# Patient Record
Sex: Male | Born: 1960 | Race: Black or African American | Hispanic: No | Marital: Single | Smoking: Former smoker
Health system: Southern US, Community
[De-identification: ages and names within clinical notes are randomized; demographics above are authoritative.]

## PROBLEM LIST (undated history)

## (undated) DIAGNOSIS — J449 Chronic obstructive pulmonary disease, unspecified: Secondary | ICD-10-CM

---

## 2020-05-16 ENCOUNTER — Emergency Department (HOSPITAL_COMMUNITY): Payer: BC Managed Care – PPO

## 2020-05-16 ENCOUNTER — Inpatient Hospital Stay (HOSPITAL_COMMUNITY)
Admission: EM | Admit: 2020-05-16 | Discharge: 2020-05-20 | DRG: 177 | Disposition: A | Discharge status: Home / Self Care | Payer: BC Managed Care – PPO | Attending: Internal Medicine | Admitting: Internal Medicine

## 2020-05-16 ENCOUNTER — Encounter (HOSPITAL_COMMUNITY): Payer: Self-pay | Admitting: Emergency Medicine

## 2020-05-16 ENCOUNTER — Other Ambulatory Visit: Payer: Self-pay

## 2020-05-16 DIAGNOSIS — Z79899 Other long term (current) drug therapy: Secondary | ICD-10-CM | POA: Diagnosis not present

## 2020-05-16 DIAGNOSIS — D538 Other specified nutritional anemias: Secondary | ICD-10-CM | POA: Diagnosis not present

## 2020-05-16 DIAGNOSIS — A419 Sepsis, unspecified organism: Secondary | ICD-10-CM | POA: Diagnosis not present

## 2020-05-16 DIAGNOSIS — M954 Acquired deformity of chest and rib: Secondary | ICD-10-CM | POA: Diagnosis present

## 2020-05-16 DIAGNOSIS — D573 Sickle-cell trait: Secondary | ICD-10-CM

## 2020-05-16 DIAGNOSIS — U071 COVID-19: Secondary | ICD-10-CM | POA: Diagnosis present

## 2020-05-16 DIAGNOSIS — R54 Age-related physical debility: Secondary | ICD-10-CM | POA: Diagnosis present

## 2020-05-16 DIAGNOSIS — J449 Chronic obstructive pulmonary disease, unspecified: Secondary | ICD-10-CM | POA: Diagnosis present

## 2020-05-16 DIAGNOSIS — J9601 Acute respiratory failure with hypoxia: Secondary | ICD-10-CM | POA: Diagnosis present

## 2020-05-16 DIAGNOSIS — I5043 Acute on chronic combined systolic (congestive) and diastolic (congestive) heart failure: Secondary | ICD-10-CM | POA: Diagnosis present

## 2020-05-16 DIAGNOSIS — F1490 Cocaine use, unspecified, uncomplicated: Secondary | ICD-10-CM

## 2020-05-16 DIAGNOSIS — I2699 Other pulmonary embolism without acute cor pulmonale: Secondary | ICD-10-CM | POA: Diagnosis present

## 2020-05-16 DIAGNOSIS — Z87891 Personal history of nicotine dependence: Secondary | ICD-10-CM | POA: Diagnosis not present

## 2020-05-16 DIAGNOSIS — R55 Syncope and collapse: Secondary | ICD-10-CM | POA: Diagnosis present

## 2020-05-16 DIAGNOSIS — I2609 Other pulmonary embolism with acute cor pulmonale: Secondary | ICD-10-CM | POA: Diagnosis not present

## 2020-05-16 DIAGNOSIS — I959 Hypotension, unspecified: Secondary | ICD-10-CM | POA: Diagnosis present

## 2020-05-16 DIAGNOSIS — I2601 Septic pulmonary embolism with acute cor pulmonale: Secondary | ICD-10-CM | POA: Diagnosis not present

## 2020-05-16 DIAGNOSIS — I82431 Acute embolism and thrombosis of right popliteal vein: Secondary | ICD-10-CM | POA: Diagnosis present

## 2020-05-16 DIAGNOSIS — Z7901 Long term (current) use of anticoagulants: Secondary | ICD-10-CM

## 2020-05-16 DIAGNOSIS — R0602 Shortness of breath: Secondary | ICD-10-CM | POA: Diagnosis present

## 2020-05-16 DIAGNOSIS — I82441 Acute embolism and thrombosis of right tibial vein: Secondary | ICD-10-CM | POA: Diagnosis not present

## 2020-05-16 DIAGNOSIS — I5031 Acute diastolic (congestive) heart failure: Secondary | ICD-10-CM | POA: Diagnosis present

## 2020-05-16 DIAGNOSIS — J1282 Pneumonia due to coronavirus disease 2019: Secondary | ICD-10-CM | POA: Diagnosis not present

## 2020-05-16 DIAGNOSIS — I5023 Acute on chronic systolic (congestive) heart failure: Secondary | ICD-10-CM | POA: Diagnosis not present

## 2020-05-16 DIAGNOSIS — Z800 Family history of malignant neoplasm of digestive organs: Secondary | ICD-10-CM

## 2020-05-16 DIAGNOSIS — J189 Pneumonia, unspecified organism: Secondary | ICD-10-CM | POA: Diagnosis not present

## 2020-05-16 DIAGNOSIS — D649 Anemia, unspecified: Secondary | ICD-10-CM | POA: Diagnosis present

## 2020-05-16 DIAGNOSIS — R42 Dizziness and giddiness: Secondary | ICD-10-CM

## 2020-05-16 DIAGNOSIS — I82409 Acute embolism and thrombosis of unspecified deep veins of unspecified lower extremity: Secondary | ICD-10-CM

## 2020-05-16 HISTORY — DX: Chronic obstructive pulmonary disease, unspecified: J44.9

## 2020-05-16 LAB — CBC WITH DIFFERENTIAL/PLATELET
Abs Immature Granulocytes: 0.17 10*3/uL — ABNORMAL HIGH (ref 0.00–0.07)
Basophils Absolute: 0.1 10*3/uL (ref 0.0–0.1)
Basophils Relative: 0 %
Eosinophils Absolute: 0 10*3/uL (ref 0.0–0.5)
Eosinophils Relative: 0 %
HCT: 27.9 % — ABNORMAL LOW (ref 39.0–52.0)
Hemoglobin: 9.8 g/dL — ABNORMAL LOW (ref 13.0–17.0)
Immature Granulocytes: 1 %
Lymphocytes Relative: 4 %
Lymphs Abs: 1 10*3/uL (ref 0.7–4.0)
MCH: 29.6 pg (ref 26.0–34.0)
MCHC: 35.1 g/dL (ref 30.0–36.0)
MCV: 84.3 fL (ref 80.0–100.0)
Monocytes Absolute: 2.1 10*3/uL — ABNORMAL HIGH (ref 0.1–1.0)
Monocytes Relative: 10 %
Neutro Abs: 18.5 10*3/uL — ABNORMAL HIGH (ref 1.7–7.7)
Neutrophils Relative %: 85 %
Platelets: 147 10*3/uL — ABNORMAL LOW (ref 150–400)
RBC: 3.31 MIL/uL — ABNORMAL LOW (ref 4.22–5.81)
RDW: 17.7 % — ABNORMAL HIGH (ref 11.5–15.5)
WBC: 21.8 10*3/uL — ABNORMAL HIGH (ref 4.0–10.5)
nRBC: 1.5 % — ABNORMAL HIGH (ref 0.0–0.2)

## 2020-05-16 LAB — TROPONIN I (HIGH SENSITIVITY)
Troponin I (High Sensitivity): 272 ng/L (ref <18)
Troponin I (High Sensitivity): 471 ng/L (ref <18)

## 2020-05-16 LAB — COMPREHENSIVE METABOLIC PANEL
ALT: 22 U/L (ref 0–44)
AST: 51 U/L — ABNORMAL HIGH (ref 15–41)
Albumin: 3 g/dL — ABNORMAL LOW (ref 3.5–5.0)
Alkaline Phosphatase: 57 U/L (ref 38–126)
Anion gap: 6 (ref 5–15)
BUN: 15 mg/dL (ref 6–20)
CO2: 21 mmol/L — ABNORMAL LOW (ref 22–32)
Calcium: 7.9 mg/dL — ABNORMAL LOW (ref 8.9–10.3)
Chloride: 114 mmol/L — ABNORMAL HIGH (ref 98–111)
Creatinine, Ser: 0.89 mg/dL (ref 0.61–1.24)
GFR, Estimated: >60 mL/min (ref >60)
Glucose, Bld: 133 mg/dL — ABNORMAL HIGH (ref 70–99)
Potassium: 4.4 mmol/L (ref 3.5–5.1)
Sodium: 141 mmol/L (ref 135–145)
Total Bilirubin: 3.5 mg/dL — ABNORMAL HIGH (ref 0.3–1.2)
Total Protein: 6.7 g/dL (ref 6.5–8.1)

## 2020-05-16 LAB — BRAIN NATRIURETIC PEPTIDE: B Natriuretic Peptide: 99.3 pg/mL (ref 0.0–100.0)

## 2020-05-16 LAB — SARS CORONAVIRUS 2 BY RT PCR (HOSPITAL ORDER, PERFORMED IN ~~LOC~~ HOSPITAL LAB): SARS Coronavirus 2: POSITIVE — AB

## 2020-05-16 MED ORDER — METHYLPREDNISOLONE SODIUM SUCC 125 MG IJ SOLR
125.0000 mg | Freq: Once | INTRAMUSCULAR | Status: AC
  Administered 2020-05-16: 125 mg via INTRAVENOUS
  Filled 2020-05-16: qty 2

## 2020-05-16 MED ORDER — GUAIFENESIN-DM 100-10 MG/5ML PO SYRP: 10.0000 mL | ORAL_SOLUTION | ORAL | Status: DC | PRN

## 2020-05-16 MED ORDER — HEPARIN BOLUS VIA INFUSION
3500.0000 [IU] | Freq: Once | INTRAVENOUS | Status: DC
  Administered 2020-05-16: 3500 [IU] via INTRAVENOUS
  Filled 2020-05-16: qty 3500

## 2020-05-16 MED ORDER — SODIUM CHLORIDE 0.9% FLUSH
3.0000 mL | Freq: Two times a day (BID) | INTRAVENOUS | Status: DC
  Administered 2020-05-17 – 2020-05-20 (×6): 3 mL via INTRAVENOUS

## 2020-05-16 MED ORDER — HEPARIN BOLUS VIA INFUSION: 4000.0000 [IU] | Freq: Once | INTRAVENOUS | Status: DC

## 2020-05-16 MED ORDER — ACETAMINOPHEN 325 MG PO TABS: 650.0000 mg | ORAL_TABLET | Freq: Four times a day (QID) | ORAL | Status: DC | PRN

## 2020-05-16 MED ORDER — SODIUM CHLORIDE 0.9 % IV SOLN
200.0000 mg | Freq: Once | INTRAVENOUS | Status: AC
  Administered 2020-05-17: 200 mg via INTRAVENOUS
  Filled 2020-05-16: qty 200

## 2020-05-16 MED ORDER — ONDANSETRON HCL 4 MG/2ML IJ SOLN: 4.0000 mg | Freq: Four times a day (QID) | INTRAMUSCULAR | Status: DC | PRN

## 2020-05-16 MED ORDER — ALBUTEROL SULFATE HFA 108 (90 BASE) MCG/ACT IN AERS
2.0000 | INHALATION_SPRAY | Freq: Four times a day (QID) | RESPIRATORY_TRACT | Status: DC
  Administered 2020-05-17 (×3): 2 via RESPIRATORY_TRACT

## 2020-05-16 MED ORDER — HYDROCODONE-ACETAMINOPHEN 5-325 MG PO TABS: 1.0000 | ORAL_TABLET | ORAL | Status: DC | PRN

## 2020-05-16 MED ORDER — SODIUM CHLORIDE 0.9 % IV BOLUS
1000.0000 mL | Freq: Once | INTRAVENOUS | Status: AC
  Administered 2020-05-16: 1000 mL via INTRAVENOUS

## 2020-05-16 MED ORDER — HEPARIN (PORCINE) 25000 UT/250ML-% IV SOLN
1100.0000 [IU]/h | INTRAVENOUS | Status: DC
  Administered 2020-05-16: 1100 [IU]/h via INTRAVENOUS
  Filled 2020-05-16: qty 250

## 2020-05-16 MED ORDER — HEPARIN (PORCINE) 25000 UT/250ML-% IV SOLN: 800.0000 [IU]/h | INTRAVENOUS | Status: DC

## 2020-05-16 MED ORDER — SODIUM CHLORIDE 0.9 % IV SOLN: INTRAVENOUS | Status: AC

## 2020-05-16 MED ORDER — SODIUM CHLORIDE 0.9 % IV SOLN
100.0000 mg | Freq: Every day | INTRAVENOUS | Status: AC
  Administered 2020-05-17 – 2020-05-20 (×4): 100 mg via INTRAVENOUS
  Filled 2020-05-16 (×4): qty 20

## 2020-05-16 MED ORDER — ALBUTEROL SULFATE HFA 108 (90 BASE) MCG/ACT IN AERS
4.0000 | INHALATION_SPRAY | Freq: Once | RESPIRATORY_TRACT | Status: AC
  Administered 2020-05-16: 4 via RESPIRATORY_TRACT
  Filled 2020-05-16: qty 6.7

## 2020-05-16 MED ORDER — IOHEXOL 350 MG/ML SOLN
100.0000 mL | Freq: Once | INTRAVENOUS | Status: AC | PRN
  Administered 2020-05-16: 100 mL via INTRAVENOUS

## 2020-05-16 MED ORDER — ONDANSETRON HCL 4 MG PO TABS: 4.0000 mg | ORAL_TABLET | Freq: Four times a day (QID) | ORAL | Status: DC | PRN

## 2020-05-16 MED ORDER — HEPARIN (PORCINE) 25000 UT/250ML-% IV SOLN: 1000.0000 [IU]/h | INTRAVENOUS | Status: DC

## 2020-05-16 MED ORDER — HEPARIN BOLUS VIA INFUSION
3500.0000 [IU] | Freq: Once | INTRAVENOUS | Status: DC
  Filled 2020-05-16: qty 3500

## 2020-05-16 MED ORDER — DEXAMETHASONE SODIUM PHOSPHATE 10 MG/ML IJ SOLN
6.0000 mg | INTRAMUSCULAR | Status: DC
  Administered 2020-05-17: 6 mg via INTRAVENOUS
  Filled 2020-05-16: qty 1

## 2020-05-16 NOTE — ED Notes (Signed)
Date and time results received: 05/16/20 2005 (use smartphrase ".now" to insert current time)  Test: Troponin Critical Value: 272  Name of Provider Notified:  Orders Received? Or Actions Taken?:waiting on orders

## 2020-05-16 NOTE — ED Triage Notes (Signed)
Pt arrived via EMS from [facility] in [place]. Pt had a syncopal episode and reported to EMS that he has had generalized weakness, shortness of breath, cough, and diarrhea for the past week. Pt has hx of COPD. Pt's initial pressure with EMS was 60 palpated. Pt did not hit his head when he had his syncopal episode. Pt was assisted to the ground by his girlfriend.

## 2020-05-16 NOTE — ED Provider Notes (Signed)
[facility]-EMERGENCY DEPT Provider Note   CSN: [id number] Arrival date & time: 05/16/20  1724     History Chief Complaint  Patient presents with  . Shortness of Breath  . Loss of Consciousness    Felicity PellegriniJerrell Sokolow is a 60 y.o. male.  Patient felt weak and almost passed out today.  He has been coughing for a while and having some diarrhea.  Patient also had dyspnea  The history is provided by the patient and medical records. No language interpreter was used.  Shortness of Breath Severity:  Moderate Onset quality:  Sudden Timing:  Constant Progression:  Worsening Chronicity:  Recurrent Context: activity   Relieved by:  Nothing Worsened by:  Nothing Ineffective treatments:  None tried Associated symptoms: syncope   Associated symptoms: no abdominal pain, no chest pain, no cough, no headaches and no rash   Loss of Consciousness Associated symptoms: shortness of breath   Associated symptoms: no chest pain, no headaches and no seizures        Past Medical History:  Diagnosis Date  . COPD (chronic obstructive pulmonary disease) (HCC)     There are no problems to display for this patient.   History reviewed. No pertinent surgical history.     History reviewed. No pertinent family history.  Social History   Tobacco Use  . Smoking status: Former Smoker    Quit date: 05/16/2016    Years since quitting: 4.0  . Smokeless tobacco: Never Used    Home Medications Prior to Admission medications   Medication Sig Start Date End Date Taking? Authorizing Provider  sildenafil (VIAGRA) 100 MG tablet Take 100 mg by mouth daily as needed for erectile dysfunction.   Yes [provider]    Allergies    Patient has no known allergies.  Review of Systems   Review of Systems  Constitutional: Negative for appetite change and fatigue.  HENT: Negative for congestion, ear discharge and sinus pressure.   Eyes: Negative for discharge.  Respiratory:  Positive for shortness of breath. Negative for cough.   Cardiovascular: Positive for syncope. Negative for chest pain.  Gastrointestinal: Negative for abdominal pain and diarrhea.  Genitourinary: Negative for frequency and hematuria.  Musculoskeletal: Negative for back pain.  Skin: Negative for rash.  Neurological: Negative for seizures and headaches.  Psychiatric/Behavioral: Negative for hallucinations.    Physical Exam Updated Vital Signs BP (!) 126/95   Pulse (!) 118   Temp (!) 97.4 F (36.3 C) (Oral)   Resp 20   Ht 5\' 8"  (1.727 m)   Wt 64.4 kg   SpO2 93%   BMI 21.59 kg/m   Physical Exam Vitals reviewed.  Constitutional:      Appearance: He is well-developed.  HENT:     Head: Normocephalic.     Nose: Nose normal.  Eyes:     General: No scleral icterus.    Extraocular Movements: EOM normal.     Conjunctiva/sclera: Conjunctivae normal.  Neck:     Thyroid: No thyromegaly.  Cardiovascular:     Rate and Rhythm: Normal rate and regular rhythm.     Heart sounds: No murmur heard. No friction rub. No gallop.   Pulmonary:     Breath sounds: No stridor. No wheezing or rales.     Comments: Tachypnea Chest:     Chest wall: No tenderness.  Abdominal:     General: There is no distension.     Tenderness: There is no abdominal tenderness. There is no rebound.  Musculoskeletal:        General: No edema. Normal range of motion.     Cervical back: Neck supple.  Lymphadenopathy:     Cervical: No cervical adenopathy.  Skin:    Findings: No erythema or rash.  Neurological:     Mental Status: He is oriented to person, place, and time.     Motor: No abnormal muscle tone.     Coordination: Coordination normal.  Psychiatric:        Mood and Affect: Mood and affect normal.        Behavior: Behavior normal.     ED Results / Procedures / Treatments   Labs (all labs ordered are listed, but only abnormal results are displayed) Labs Reviewed  SARS CORONAVIRUS 2 BY RT PCR  (HOSPITAL ORDER, PERFORMED IN [facility]) - Abnormal; Notable for the following components:      Result Value   SARS Coronavirus 2 POSITIVE (*)    All other components within normal limits  CBC WITH DIFFERENTIAL/PLATELET - Abnormal; Notable for the following components:   WBC 21.8 (*)    RBC 3.31 (*)    Hemoglobin 9.8 (*)    HCT 27.9 (*)    RDW 17.7 (*)    Platelets 147 (*)    nRBC 1.5 (*)    Neutro Abs 18.5 (*)    Monocytes Absolute 2.1 (*)    Abs Immature Granulocytes 0.17 (*)    All other components within normal limits  COMPREHENSIVE METABOLIC PANEL - Abnormal; Notable for the following components:   Chloride 114 (*)    CO2 21 (*)    Glucose, Bld 133 (*)    Calcium 7.9 (*)    Albumin 3.0 (*)    AST 51 (*)    Total Bilirubin 3.5 (*)    All other components within normal limits  TROPONIN I (HIGH SENSITIVITY) - Abnormal; Notable for the following components:   Troponin I (High Sensitivity) 272 (*)    All other components within normal limits  TROPONIN I (HIGH SENSITIVITY) - Abnormal; Notable for the following components:   Troponin I (High Sensitivity) 471 (*)    All other components within normal limits  BRAIN NATRIURETIC PEPTIDE    EKG EKG Interpretation  Date/Time:  Sunday May 16 2020 22:49:07 EST Ventricular Rate:  119 PR Interval:    QRS Duration: 90 QT Interval:  329 QTC Calculation: 463 R Axis:   64 Text Interpretation: Sinus tachycardia Abnormal R-wave progression, early transition Borderline T abnormalities, inferior leads Confirmed by Bethann Berkshire [phone]) on 05/16/2020 11:01:51 PM Also confirmed by Bethann Berkshire [phone]  on 05/16/2020 11:02:27 PM   Radiology CT Angio Chest PE W and/or Wo Contrast  Result Date: 05/16/2020 CLINICAL DATA:  Pulmonary embolus suspected with high probability. Syncopal episode. Generalized weakness, shortness of breath, cough, and diarrhea. EXAM: CT ANGIOGRAPHY CHEST WITH CONTRAST TECHNIQUE: Multidetector CT  imaging of the chest was performed using the standard protocol during bolus administration of intravenous contrast. Multiplanar CT image reconstructions and MIPs were obtained to evaluate the vascular anatomy. CONTRAST:  OMNIPAQUE IOHEXOL 350 MG/ML SOLN COMPARISON:  Chest radiograph 05/16/2020 FINDINGS: Cardiovascular: Motion artifact limits examination. There is however moderately good visualization of the central and segmental pulmonary arteries. Multiple filling defects are demonstrated in the distal main pulmonary arteries and in multiple upper lobe and lower lobe segmental branches consistent with multiple acute pulmonary emboli. The RV to LV ratio is 1.2, suggesting evidence for right heart strain. No  pericardial effusions. Normal caliber thoracic aorta. No aortic dissection. Great vessel origins are patent. Mediastinum/Nodes: Small esophageal hiatal hernia. Esophagus is decompressed. No significant lymphadenopathy. Lungs/Pleura: Mild atelectasis in the right lung base. Lungs are otherwise clear. No pleural effusions. No pneumothorax. Airways are patent. Upper Abdomen: No acute abnormalities demonstrated in the visualized upper abdomen. Probable gallstones incompletely evaluated. Musculoskeletal: Degenerative changes in the spine and shoulders. Review of the MIP images confirms the above findings. IMPRESSION: 1. Multiple bilateral acute pulmonary emboli. CT evidence of right heart strain (RV/LV Ratio 1.2) consistent with at least submassive (intermediate risk) PE. The presence of right heart strain has been associated with an increased risk of morbidity and mortality. 2. Small esophageal hiatal hernia. 3. Probable gallstones. Critical Value/emergent results were called by telephone at the time of interpretation on 05/16/2020 at 10:51 pm to provider Zaydyn Havey , who verbally acknowledged these results. Electronically Signed   By: Burman Nieves M.D.   On: 05/16/2020 22:54   DG Chest Port 1  View  Result Date: 05/16/2020 CLINICAL DATA:  Syncopal episode EXAM: PORTABLE CHEST 1 VIEW COMPARISON:  07/29/2019 FINDINGS: There are advanced degenerative changes of the right glenohumeral joint. The heart size is stable from prior study. There is no pneumothorax. No large pleural effusion. No focal infiltrate. IMPRESSION: No active disease. Electronically Signed   By: Katherine Mantle M.D.   On: 05/16/2020 19:02    Procedures Procedures (including critical care time)  Medications Ordered in ED Medications  heparin bolus via infusion 3,500 Units (has no administration in time range)    Followed by  heparin ADULT infusion 100 units/mL (25000 units/237mL) (has no administration in time range)  sodium chloride 0.9 % bolus 1,000 mL (0 mLs Intravenous Stopped 05/16/20 2049)  albuterol (VENTOLIN HFA) 108 (90 Base) MCG/ACT inhaler 4 puff (4 puffs Inhalation Given 05/16/20 1842)  methylPREDNISolone sodium succinate (SOLU-MEDROL) 125 mg/2 mL injection 125 mg (125 mg Intravenous Given 05/16/20 1841)  sodium chloride 0.9 % bolus 1,000 mL (0 mLs Intravenous Stopped 05/16/20 2249)  iohexol (OMNIPAQUE) 350 MG/ML injection 100 mL (100 mLs Intravenous Contrast Given 05/16/20 2245)    ED Course  I have reviewed the triage vital signs and the nursing notes.  Pertinent labs & imaging results that were available during my care of the patient were reviewed by me and considered in my medical decision making (see chart for details). Abdulloh Ullom was evaluated in Emergency Department on 05/20/2020 for the symptoms described in the history of present illness. He was evaluated in the context of the global COVID-19 pandemic, which necessitated consideration that the patient might be at risk for infection with the SARS-CoV-2 virus that causes COVID-19. Institutional protocols and algorithms that pertain to the evaluation of patients at risk for COVID-19 are in a state of rapid change based on information released by  regulatory bodies including the CDC and federal and state organizations. These policies and algorithms were followed during the patient's care in the ED.    CRITICAL CARE Performed by: Bethann Berkshire Total critical care time: 45 minutes Critical care time was exclusive of separately billable procedures and treating other patients. Critical care was necessary to treat or prevent imminent or life-threatening deterioration. Critical care was time spent personally by me on the following activities: development of treatment plan with patient and/or surrogate as well as nursing, discussions with consultants, evaluation of patient's response to treatment, examination of patient, obtaining history from patient or surrogate, ordering and performing treatments and interventions, ordering  and review of laboratory studies, ordering and review of radiographic studies, pulse oximetry and re-evaluation of patient's condition. I initially spoke to cardiology with the elevated troponin.  He suggested getting a CT angio of the chest.  Patient's COVID test did come back positive and had a CT angio showed PE. MDM Rules/Calculators/A&P                          Patient with COVID infection and hypoxia and positive PE.  He will be admitted to medicine and started on heparin Final Clinical Impression(s) / ED Diagnoses Final diagnoses:  Syncope and collapse    Rx / DC Orders ED Discharge Orders    None       Bethann Berkshire, MD 05/20/20 1003

## 2020-05-16 NOTE — ED Notes (Addendum)
Date and time results received: 05/16/20 2227 (use smartphrase ".now" to insert current time)  Test: Troponin Critical Value:471  Name of Provider Notified: zammit md  Orders Received? Or Actions Taken?: Waiting on orders

## 2020-05-16 NOTE — H&P (Addendum)
Derrick Huffman GQB:[id number] DOB: [birth year] DOA: 05/16/2020    PCP: Bethanie Dicker, MD   Outpatient Specialists:  NONE    Patient arrived to ER on 05/16/20 at 1724 Referred by Attending Bethann Berkshire, MD    Patient coming from: home Lives  With SO    Chief Complaint:  Chief Complaint  Patient presents with  . Shortness of Breath  . Loss of Consciousness    HPI: Derrick Huffman is a 60 y.o. male with medical history significant of COPD    Presented with   Syncope and shortness of breath Patient was brought in by EMS from [facility] at [employer].  Where he was found to have a syncopal episode he reported some generalized fatigue shortness of breath cough diarrhea for past 1 week. On EMS arrival his blood pressures was in the 60s.  Denied any head injury. Apparently he was gradually assisted to the ground by his girlfriend Reports he does not smoke he quit when he was diagnosis of COPD  states that he drinks only on occasion   Infectious risk factors:  Reports  shortness of breath, syncope    Has   been vaccinated against COVID last March not boosted   Initial COVID TEST    POSITIVE,     Lab Results  Component Value Date   SARSCOV2NAA POSITIVE (A) 05/16/2020    Regarding pertinent Chronic problems:     COPD - not  followed by pulmonology  not  on baseline oxygen         While in ER: Tachycardic up to 118 Blood pressure stabilized at 126/95 CTA postive for PE Troponin elevated initially 272 ->4 71 CTA showed bilateral PEs with possible right-sided strain Started on heparin drip Also noted to have white blood cell count up to 21.8 ER Provider Called: Cardiology    They Recommend admit to medicine troponin elevation most likely secondary to PE Will see in AM   Hospitalist was called for admission for COVID infection syncope and pulmonary embolism  The following Work up has been ordered so far:  Orders Placed This Encounter  Procedures  . SARS  Coronavirus 2 by RT PCR (hospital order, performed in [facility] hospital lab) Nasopharyngeal Nasopharyngeal Swab  . DG Chest Port 1 View  . CT Angio Chest PE W and/or Wo Contrast  . CBC with Differential/Platelet  . Comprehensive metabolic panel  . Brain natriuretic peptide  . Inpatient consult to Cardiology  ALL PATIENTS BEING ADMITTED/HAVING PROCEDURES NEED COVID-19 SCREENING  . Consult to hospitalist  ALL PATIENTS BEING ADMITTED/HAVING PROCEDURES NEED COVID-19 SCREENING  . heparin per pharmacy consult  . Airborne and Contact precautions  . EKG 12-Lead  . EKG 12-Lead     Following Medications were ordered in ER: Medications  heparin bolus via infusion 3,500 Units (has no administration in time range)    Followed by  heparin ADULT infusion 100 units/mL (25000 units/216mL) (has no administration in time range)  sodium chloride 0.9 % bolus 1,000 mL (0 mLs Intravenous Stopped 05/16/20 2049)  albuterol (VENTOLIN HFA) 108 (90 Base) MCG/ACT inhaler 4 puff (4 puffs Inhalation Given 05/16/20 1842)  methylPREDNISolone sodium succinate (SOLU-MEDROL) 125 mg/2 mL injection 125 mg (125 mg Intravenous Given 05/16/20 1841)  sodium chloride 0.9 % bolus 1,000 mL (0 mLs Intravenous Stopped 05/16/20 2249)  iohexol (OMNIPAQUE) 350 MG/ML injection 100 mL (100 mLs Intravenous Contrast Given 05/16/20 2245)        Consult Orders  (From admission, onward)  Start     Ordered   05/16/20 2255  Consult to hospitalist  ALL PATIENTS BEING ADMITTED/HAVING PROCEDURES NEED COVID-19 SCREENING  Once       Comments: ALL PATIENTS BEING ADMITTED/HAVING PROCEDURES NEED COVID-19 SCREENING  Provider:  (Not yet assigned)  Question Answer Comment  Place call to: [facility],   call [phone]   Reason for Consult Admit      05/16/20 2254           Significant initial  Findings: Abnormal Labs Reviewed  SARS CORONAVIRUS 2 BY RT PCR (HOSPITAL ORDER, PERFORMED IN [place] [facility] LAB) - Abnormal;  Notable for the following components:      Result Value   SARS Coronavirus 2 POSITIVE (*)    All other components within normal limits  CBC WITH DIFFERENTIAL/PLATELET - Abnormal; Notable for the following components:   WBC 21.8 (*)    RBC 3.31 (*)    Hemoglobin 9.8 (*)    HCT 27.9 (*)    RDW 17.7 (*)    Platelets 147 (*)    nRBC 1.5 (*)    Neutro Abs 18.5 (*)    Monocytes Absolute 2.1 (*)    Abs Immature Granulocytes 0.17 (*)    All other components within normal limits  COMPREHENSIVE METABOLIC PANEL - Abnormal; Notable for the following components:   Chloride 114 (*)    CO2 21 (*)    Glucose, Bld 133 (*)    Calcium 7.9 (*)    Albumin 3.0 (*)    AST 51 (*)    Total Bilirubin 3.5 (*)    All other components within normal limits  TROPONIN I (HIGH SENSITIVITY) - Abnormal; Notable for the following components:   Troponin I (High Sensitivity) 272 (*)    All other components within normal limits  TROPONIN I (HIGH SENSITIVITY) - Abnormal; Notable for the following components:   Troponin I (High Sensitivity) 471 (*)    All other components within normal limits    Otherwise labs showing:    Recent Labs  Lab 05/16/20 1842  NA 141  K 4.4  CO2 21*  GLUCOSE 133*  BUN 15  CREATININE 0.89  CALCIUM 7.9*    Cr    stable,    Lab Results  Component Value Date   CREATININE 0.89 05/16/2020    Recent Labs  Lab 05/16/20 1842  AST 51*  ALT 22  ALKPHOS 57  BILITOT 3.5*  PROT 6.7  ALBUMIN 3.0*   Lab Results  Component Value Date   CALCIUM 7.9 (L) 05/16/2020    WBC     Component Value Date/Time   WBC 21.8 (H) 05/16/2020 1842   LYMPHSABS 1.0 05/16/2020 1842   MONOABS 2.1 (H) 05/16/2020 1842   EOSABS 0.0 05/16/2020 1842   BASOSABS 0.1 05/16/2020 1842    Plt: Lab Results  Component Value Date   PLT 147 (L) 05/16/2020    Lactic Acid, Venous  Ordered   Procalcitonin  Ordered   COVID-19 Labs  Recent Labs    05/17/20 0009  FERRITIN 74  CRP 1.3*    Lab  Results  Component Value Date   SARSCOV2NAA POSITIVE (A) 05/16/2020    HG/HCT unsure what the baseline is    Component Value Date/Time   HGB 9.8 (L) 05/16/2020 1842   HCT 27.9 (L) 05/16/2020 1842   MCV 84.3 05/16/2020 1842     Troponin 272 - 471 Cardiac Panel (last 3 results)     ECG: Ordered Personally  reviewed by me showing: HR : 119 Rhythm:   Sinus tachycardia   nonspecific changes,  QTC 463   BNP (last 3 results) Recent Labs    05/16/20 1833  BNP 99.3      UA   no evidence of UTI      Urine analysis:    Component Value Date/Time   COLORURINE YELLOW 05/17/2020 0135   APPEARANCEUR CLEAR 05/17/2020 0135   LABSPEC 1.031 (H) 05/17/2020 0135   PHURINE 5.0 05/17/2020 0135   GLUCOSEU NEGATIVE 05/17/2020 0135   HGBUR NEGATIVE 05/17/2020 0135   BILIRUBINUR NEGATIVE 05/17/2020 0135   KETONESUR NEGATIVE 05/17/2020 0135   PROTEINUR NEGATIVE 05/17/2020 0135   NITRITE NEGATIVE 05/17/2020 0135   LEUKOCYTESUR NEGATIVE 05/17/2020 0135    Ordered   CXR -  NON acute    CTA chest -multiple bilateral PE, evidence of right heart strain no evidence of infiltrate Gallstones    ED Triage Vitals  Enc Vitals Group     BP 05/16/20 1740 (!) 125/96     Pulse Rate 05/16/20 1740 (!) 117     Resp 05/16/20 1740 (!) 26     Temp 05/16/20 1740 (!) 97.4 F (36.3 C)     Temp Source 05/16/20 1740 Oral     SpO2 05/16/20 1738 96 %     Weight 05/16/20 1755 142 lb (64.4 kg)     Height 05/16/20 1755 5\' 8"  (1.727 m)     Head Circumference --      Peak Flow --      Pain Score 05/16/20 1753 0     Pain Loc --      Pain Edu? --      Excl. in GC? --   TMAX(24)@       Latest  Blood pressure (!) 126/95, pulse (!) 118, temperature (!) 97.4 F (36.3 C), temperature source Oral, resp. rate 20, height 5\' 8"  (1.727 m), weight 64.4 kg, SpO2 93 %.   Review of Systems:    Pertinent positives include:   chills, fatigue, shortness of breath at rest.  dyspnea on exertion,   Constitutional:  No  weight loss, night sweats, Fevers, weight loss  HEENT:  No headaches, Difficulty swallowing,Tooth/dental problems,Sore throat,  No sneezing, itching, ear ache, nasal congestion, post nasal drip,  Cardio-vascular:  No chest pain, Orthopnea, PND, anasarca, dizziness, palpitations.no Bilateral lower extremity swelling  GI:  No heartburn, indigestion, abdominal pain, nausea, vomiting, diarrhea, change in bowel habits, loss of appetite, melena, blood in stool, hematemesis Resp:  no No excess mucus, no productive cough, No non-productive cough, No coughing up of blood.No change in color of mucus.No wheezing. Skin:  no rash or lesions. No jaundice GU:  no dysuria, change in color of urine, no urgency or frequency. No straining to urinate.  No flank pain.  Musculoskeletal:  No joint pain or no joint swelling. No decreased range of motion. No back pain.  Psych:  No change in mood or affect. No depression or anxiety. No memory loss.  Neuro: no localizing neurological complaints, no tingling, no weakness, no double vision, no gait abnormality, no slurred speech, no confusion  All systems reviewed and apart from HOPI all are negative  Past Medical History:   Past Medical History:  Diagnosis Date  . COPD (chronic obstructive pulmonary disease) (HCC)       History reviewed. No pertinent surgical history.  Social History:  Ambulatory   independently       reports that he quit smoking  about 4 years ago. He has never used smokeless tobacco. No history on file for alcohol use and drug use.     Family History:   Family History  Problem Relation Age of Onset  . GI Disease Other     Allergies: No Known Allergies   Prior to Admission medications   Medication Sig Start Date End Date Taking? Authorizing Provider  sildenafil (VIAGRA) 100 MG tablet Take 100 mg by mouth daily as needed for erectile dysfunction.   Yes [provider]   Physical Exam: Vitals with BMI 05/16/2020  05/16/2020 05/16/2020  Height - - -  Weight - - -  BMI - - -  Systolic 126 - -  Diastolic 95 - -  Pulse 118 116 109    1. General:  in No Acute distress   Chronically ill  -appearing 2. Psychological: Alert and  Oriented 3. Head/ENT:     Dry Mucous Membranes                          Head Non traumatic, neck supple                            Poor Dentition 4. SKIN decreased Skin turgor,  Skin clean Dry and intact no rash 5. Heart: Regular rate and rhythm no  Murmur, no Rub or gallop 6. Lungs:  Minimal  Wheezes  7. Abdomen: Soft,  non-tender, Non distended  bowel sounds present 8. Lower extremities: no clubbing, cyanosis, no  edema 9. Neurologically Grossly intact, moving all 4 extremities equally  10. MSK: Normal range of motion   All other LABS:     Recent Labs  Lab 05/16/20 1842  WBC 21.8*  NEUTROABS 18.5*  HGB 9.8*  HCT 27.9*  MCV 84.3  PLT 147*     Recent Labs  Lab 05/16/20 1842  NA 141  K 4.4  CL 114*  CO2 21*  GLUCOSE 133*  BUN 15  CREATININE 0.89  CALCIUM 7.9*     Recent Labs  Lab 05/16/20 1842  AST 51*  ALT 22  ALKPHOS 57  BILITOT 3.5*  PROT 6.7  ALBUMIN 3.0*       Cultures: No results found for: SDES, SPECREQUEST, CULT, REPTSTATUS   Radiological Exams on Admission: CT Angio Chest PE W and/or Wo Contrast  Result Date: 05/16/2020 CLINICAL DATA:  Pulmonary embolus suspected with high probability. Syncopal episode. Generalized weakness, shortness of breath, cough, and diarrhea. EXAM: CT ANGIOGRAPHY CHEST WITH CONTRAST TECHNIQUE: Multidetector CT imaging of the chest was performed using the standard protocol during bolus administration of intravenous contrast. Multiplanar CT image reconstructions and MIPs were obtained to evaluate the vascular anatomy. CONTRAST:  OMNIPAQUE IOHEXOL 350 MG/ML SOLN COMPARISON:  Chest radiograph 05/16/2020 FINDINGS: Cardiovascular: Motion artifact limits examination. There is however moderately good visualization  of the central and segmental pulmonary arteries. Multiple filling defects are demonstrated in the distal main pulmonary arteries and in multiple upper lobe and lower lobe segmental branches consistent with multiple acute pulmonary emboli. The RV to LV ratio is 1.2, suggesting evidence for right heart strain. No pericardial effusions. Normal caliber thoracic aorta. No aortic dissection. Great vessel origins are patent. Mediastinum/Nodes: Small esophageal hiatal hernia. Esophagus is decompressed. No significant lymphadenopathy. Lungs/Pleura: Mild atelectasis in the right lung base. Lungs are otherwise clear. No pleural effusions. No pneumothorax. Airways are patent. Upper Abdomen: No acute abnormalities demonstrated in  the visualized upper abdomen. Probable gallstones incompletely evaluated. Musculoskeletal: Degenerative changes in the spine and shoulders. Review of the MIP images confirms the above findings. IMPRESSION: 1. Multiple bilateral acute pulmonary emboli. CT evidence of right heart strain (RV/LV Ratio 1.2) consistent with at least submassive (intermediate risk) PE. The presence of right heart strain has been associated with an increased risk of morbidity and mortality. 2. Small esophageal hiatal hernia. 3. Probable gallstones. Critical Value/emergent results were called by telephone at the time of interpretation on 05/16/2020 at 10:51 pm to provider JOSEPH ZAMMIT , who verbally acknowledged these results. Electronically Signed   By: Burman NievesWilliam  Stevens M.D.   On: 05/16/2020 22:54   DG Chest Port 1 View  Result Date: 05/16/2020 CLINICAL DATA:  Syncopal episode EXAM: PORTABLE CHEST 1 VIEW COMPARISON:  07/29/2019 FINDINGS: There are advanced degenerative changes of the right glenohumeral joint. The heart size is stable from prior study. There is no pneumothorax. No large pleural effusion. No focal infiltrate. IMPRESSION: No active disease. Electronically Signed   By: Katherine Mantlehristopher  Green M.D.   On: 05/16/2020  19:02    Chart has been reviewed   Assessment/Plan  60 y.o. male with medical history significant of COPD  Admitted for Covid infection and pulmonary embolism resulting in syncope  Present on Admission: . Pulmonary embolism (HCC) -  Admit to  stepdown Initiated heparin drip  Would likely benefit from case manager consult for long term anticoagulation Hold home blood pressure medications avoid hypotension Cycle cardiac enzymes initially troponin elevated 240-471 continue to trend  Order echogram and lower extremity Dopplers  Most likely risk factors for hypercoagulable state being Covid  Given hypotension initially and massive PE [facility] consulted   per PCCM patient not a candidate for tPA currently But given initial syncope will consult  discussed case with PCCM 4:04 AM Will see in consult when able  . COVID-19 virus infection -chest x-ray CTA without any evidence of pulmonary infiltrates by patient is hypoxic most likely secondary to large bilateral pulmonary PE Initiated remdesivir Given wheezing in history of COPD start Decadron Obtain inflammatory markers At this point no indication for immunomodulators Pronate is able On heparin  . Syncope and collapse -in the setting of large PE, continue cycle cardiac enzymes monitor on telemetry obtain echogram to further evaluate degree of right heart  elevated troponin indicative of poor prognosis BNP 99 Appreciate PCCM consult  Anemia -unclear baseline we will obtain anemia panel Hemoccult stool monitor blood counts while on anticoagulation  Other plan as per orders.  DVT prophylaxis:    heparin bolus via infusion 3,500 Units Start: 05/16/20 2315    Code Status:    Code Status: Not on file FULL CODE  as per patient   I had personally discussed CODE STATUS with patient     Family Communication:   Family not at  Bedside    Disposition Plan:       To home once workup is complete and patient is stable   Following barriers for  discharge:                                                        Anemia stable  Will need to be able to tolerate PO                            Will likely need home health, home O2, set up                           Will need consultants to evaluate patient prior to discharge                      Consults called:  Cardiology is aware, PCCM is aware  Admission status:  ED Disposition    ED Disposition Condition Comment   Admit  [facility][id number][facility]  Level of Care: Progressive [102]  Admit to Progressive based on following criteria: CARDIOVASCULAR & THORACIC of moderate stability with acute coronary syndrome symptoms/low risk myocardial infarction/hypertensive urgency/arrhythmias/heart failure potentially compromising stability and stable post cardiovascular intervention patients.  [facility] if equivalent level of care is available:: No  Covid Evaluation: Confirmed COVID Positive  Diagnosis: Pulmonary embolism (HCC) [241700]  Admitting Physician: Therisa DoyneUTOVA, Essam Lowdermilk [[id number]]  Attending Physician: Therisa DoyneUTOVA, Janeil Schexnayder [[id number]]  Estimated length of stay: past midnight tomorrow  Certification:: I certify this patient will need inpatient services for at least 2 midnights         inpatient    I Expect 2 midnight stay secondary to severity of patient's current illness need for inpatient interventions justified by the following:  hemodynamic instability despite optimal treatment (tachycardia  hypoxia,  )  Severe lab/radiological/exam abnormalities including:    bilateral PE and extensive comorbidities including: .  COPD/asthma   That are currently affecting medical management.   I expect  patient to be hospitalized for 2 midnights requiring inpatient medical care.  Patient is at high risk for adverse outcome (such as loss of life or  disability) if not treated.  Indication for inpatient stay as follows:   Hemodynamic instability despite maximal medical therapy,    severe pain requiring acute inpatient management,  inability to maintain oral hydration    New or worsening hypoxia Syncope  Need for IV antivirals  IV fluids,   IV anticoagulation   Level of care  SDU tele indefinitely please discontinue once patient no longer qualifies COVID-19 Labs    Lab Results  Component Value Date   SARSCOV2NAA POSITIVE (A) 05/16/2020     Precautions: admitted as covid positive Airborne and Contact precautions    PPE: Used by the provider:   P100  eye Goggles,  Gloves      Marykathryn Carboni 05/16/2020, 4:03 AM    [facility]     after 2 AM please page floor coverage PA If 7AM-7PM, please contact the day team taking care of the patient using [email or web address]   Patient was evaluated in the context of the global COVID-19 pandemic, which necessitated consideration that the patient might be at risk for infection with the SARS-CoV-2 virus that causes COVID-19. Institutional protocols and algorithms that pertain to the evaluation of patients at risk for COVID-19 are in a state of rapid change based on information released by regulatory bodies including the CDC and federal and state organizations. These policies and algorithms were followed during the patient's care.

## 2020-05-16 NOTE — Progress Notes (Addendum)
ANTICOAGULATION CONSULT NOTE - Initial Consult  Pharmacy Consult for Heparin Indication: pulmonary embolus  No Known Allergies  Patient Measurements: Height: 5\' 8"  (172.7 cm) Weight: 64.4 kg (142 lb) IBW/kg (Calculated) : 68.4 Heparin Dosing Weight: total body weight  Vital Signs: Temp: 97.4 F (36.3 C) (01/23 1740) Temp Source: Oral (01/23 1740) BP: 126/95 (01/23 2300) Pulse Rate: 118 (01/23 2300)  Labs: Recent Labs    05/16/20 1833 05/16/20 1842 05/16/20 2033  HGB  --  9.8*  --   HCT  --  27.9*  --   PLT  --  147*  --   CREATININE  --  0.89  --   TROPONINIHS 272*  --  471*    Estimated Creatinine Clearance: 81.4 mL/min (by C-G formula based on SCr of 0.89 mg/dL).   Medical History: Past Medical History:  Diagnosis Date  . COPD (chronic obstructive pulmonary disease) (HCC)     Medications:  No anticoagulation PTA  Assessment:  60 yr male presents to ED s/p syncopal episode.  PMH significant for COPD  CTAngio = + multiple bilateral PE with evidence of right heart strain  Pharmacy consulted to dose IV heparin  Goal of Therapy:  Heparin level 0.3-0.7 units/ml Monitor platelets by anticoagulation protocol: Yes   Plan:  Heparin 3500 units IV bolus x 1 Heparin gtt @ 1100 units/hr Check heparin level 6 hr after IV heparin started Follow daily heparin level & CBC Follow for signs & symptoms of bleeding  46, PharmD 05/16/2020,11:09 PM  ADDENDUM:  IV heparin gtt infusing @ 1100 units/hr. Heparin level = 0.19 (subtherapeutic) CBC still in process Confirmed with RN no infusion/line issues and no complications of therapy noted  Plan: - Rebolus heparin 2000 units IV x 1 then increase heparin rate to 1350 units/hr - Recheck heparin level 6 hr after rate increase  05/18/2020, PharmD 05/17/2020 @ 06:38

## 2020-05-17 ENCOUNTER — Other Ambulatory Visit (HOSPITAL_COMMUNITY): Payer: BC Managed Care – PPO

## 2020-05-17 ENCOUNTER — Inpatient Hospital Stay (HOSPITAL_COMMUNITY): Payer: BC Managed Care – PPO

## 2020-05-17 ENCOUNTER — Encounter (HOSPITAL_COMMUNITY): Payer: Self-pay | Admitting: Internal Medicine

## 2020-05-17 DIAGNOSIS — I5031 Acute diastolic (congestive) heart failure: Secondary | ICD-10-CM | POA: Insufficient documentation

## 2020-05-17 DIAGNOSIS — D538 Other specified nutritional anemias: Secondary | ICD-10-CM | POA: Diagnosis not present

## 2020-05-17 DIAGNOSIS — A419 Sepsis, unspecified organism: Secondary | ICD-10-CM

## 2020-05-17 DIAGNOSIS — J9601 Acute respiratory failure with hypoxia: Secondary | ICD-10-CM | POA: Insufficient documentation

## 2020-05-17 DIAGNOSIS — I2609 Other pulmonary embolism with acute cor pulmonale: Secondary | ICD-10-CM

## 2020-05-17 DIAGNOSIS — U071 COVID-19: Secondary | ICD-10-CM | POA: Diagnosis present

## 2020-05-17 DIAGNOSIS — R55 Syncope and collapse: Secondary | ICD-10-CM

## 2020-05-17 DIAGNOSIS — I2601 Septic pulmonary embolism with acute cor pulmonale: Secondary | ICD-10-CM

## 2020-05-17 DIAGNOSIS — J189 Pneumonia, unspecified organism: Secondary | ICD-10-CM

## 2020-05-17 DIAGNOSIS — I5023 Acute on chronic systolic (congestive) heart failure: Secondary | ICD-10-CM

## 2020-05-17 DIAGNOSIS — I2699 Other pulmonary embolism without acute cor pulmonale: Secondary | ICD-10-CM

## 2020-05-17 DIAGNOSIS — R42 Dizziness and giddiness: Secondary | ICD-10-CM | POA: Insufficient documentation

## 2020-05-17 DIAGNOSIS — J1282 Pneumonia due to coronavirus disease 2019: Secondary | ICD-10-CM | POA: Insufficient documentation

## 2020-05-17 DIAGNOSIS — D649 Anemia, unspecified: Secondary | ICD-10-CM | POA: Diagnosis present

## 2020-05-17 LAB — FERRITIN
Ferritin: 74 ng/mL (ref 24–336)
Ferritin: 67 ng/mL (ref 24–336)

## 2020-05-17 LAB — COMPREHENSIVE METABOLIC PANEL
ALT: 23 U/L (ref 0–44)
ALT: 22 U/L (ref 0–44)
AST: 33 U/L (ref 15–41)
AST: 27 U/L (ref 15–41)
Albumin: 3.2 g/dL — ABNORMAL LOW (ref 3.5–5.0)
Albumin: 3.3 g/dL — ABNORMAL LOW (ref 3.5–5.0)
Alkaline Phosphatase: 67 U/L (ref 38–126)
Alkaline Phosphatase: 62 U/L (ref 38–126)
Anion gap: 7 (ref 5–15)
Anion gap: 7 (ref 5–15)
BUN: 15 mg/dL (ref 6–20)
BUN: 13 mg/dL (ref 6–20)
CO2: 21 mmol/L — ABNORMAL LOW (ref 22–32)
CO2: 22 mmol/L (ref 22–32)
Calcium: 8.6 mg/dL — ABNORMAL LOW (ref 8.9–10.3)
Calcium: 8.9 mg/dL (ref 8.9–10.3)
Chloride: 110 mmol/L (ref 98–111)
Chloride: 112 mmol/L — ABNORMAL HIGH (ref 98–111)
Creatinine, Ser: 0.97 mg/dL (ref 0.61–1.24)
Creatinine, Ser: 0.9 mg/dL (ref 0.61–1.24)
GFR, Estimated: >60 mL/min (ref >60)
GFR, Estimated: >60 mL/min (ref >60)
Glucose, Bld: 148 mg/dL — ABNORMAL HIGH (ref 70–99)
Glucose, Bld: 140 mg/dL — ABNORMAL HIGH (ref 70–99)
Potassium: 4.4 mmol/L (ref 3.5–5.1)
Potassium: 4.2 mmol/L (ref 3.5–5.1)
Sodium: 138 mmol/L (ref 135–145)
Sodium: 141 mmol/L (ref 135–145)
Total Bilirubin: 1.2 mg/dL (ref 0.3–1.2)
Total Bilirubin: 1.3 mg/dL — ABNORMAL HIGH (ref 0.3–1.2)
Total Protein: 6.8 g/dL (ref 6.5–8.1)
Total Protein: 6.9 g/dL (ref 6.5–8.1)

## 2020-05-17 LAB — CBC WITH DIFFERENTIAL/PLATELET
Abs Immature Granulocytes: 0.04 10*3/uL (ref 0.00–0.07)
Basophils Absolute: 0 10*3/uL (ref 0.0–0.1)
Basophils Relative: 0 %
Eosinophils Absolute: 0 10*3/uL (ref 0.0–0.5)
Eosinophils Relative: 0 %
HCT: 26.2 % — ABNORMAL LOW (ref 39.0–52.0)
Hemoglobin: 9.2 g/dL — ABNORMAL LOW (ref 13.0–17.0)
Immature Granulocytes: 0 %
Lymphocytes Relative: 14 %
Lymphs Abs: 1.5 10*3/uL (ref 0.7–4.0)
MCH: 29.2 pg (ref 26.0–34.0)
MCHC: 35.1 g/dL (ref 30.0–36.0)
MCV: 83.2 fL (ref 80.0–100.0)
Monocytes Absolute: 1.2 10*3/uL — ABNORMAL HIGH (ref 0.1–1.0)
Monocytes Relative: 11 %
Neutro Abs: 8.2 10*3/uL — ABNORMAL HIGH (ref 1.7–7.7)
Neutrophils Relative %: 75 %
Platelets: 148 10*3/uL — ABNORMAL LOW (ref 150–400)
RBC: 3.15 MIL/uL — ABNORMAL LOW (ref 4.22–5.81)
RDW: 17.2 % — ABNORMAL HIGH (ref 11.5–15.5)
WBC: 11 10*3/uL — ABNORMAL HIGH (ref 4.0–10.5)
nRBC: 2.7 % — ABNORMAL HIGH (ref 0.0–0.2)

## 2020-05-17 LAB — RETICULOCYTES
Immature Retic Fract: 23.2 % — ABNORMAL HIGH (ref 2.3–15.9)
RBC.: 2.87 MIL/uL — ABNORMAL LOW (ref 4.22–5.81)
Retic Count, Absolute: 220.5 10*3/uL — ABNORMAL HIGH (ref 19.0–186.0)
Retic Ct Pct: 7.6 % — ABNORMAL HIGH (ref 0.4–3.1)

## 2020-05-17 LAB — URINALYSIS, ROUTINE W REFLEX MICROSCOPIC
Bilirubin Urine: NEGATIVE
Glucose, UA: NEGATIVE mg/dL
Hgb urine dipstick: NEGATIVE
Ketones, ur: NEGATIVE mg/dL
Leukocytes,Ua: NEGATIVE
Nitrite: NEGATIVE
Protein, ur: NEGATIVE mg/dL
Specific Gravity, Urine: 1.031 — ABNORMAL HIGH (ref 1.005–1.030)
pH: 5 (ref 5.0–8.0)

## 2020-05-17 LAB — IRON AND TIBC
Iron: 41 ug/dL — ABNORMAL LOW (ref 45–182)
Saturation Ratios: 14 % — ABNORMAL LOW (ref 17.9–39.5)
TIBC: 296 ug/dL (ref 250–450)
UIBC: 255 ug/dL

## 2020-05-17 LAB — C-REACTIVE PROTEIN
CRP: 1.3 mg/dL — ABNORMAL HIGH (ref <1.0)
CRP: 1.6 mg/dL — ABNORMAL HIGH (ref <1.0)

## 2020-05-17 LAB — LACTIC ACID, PLASMA
Lactic Acid, Venous: 1.2 mmol/L (ref 0.5–1.9)
Lactic Acid, Venous: 1.5 mmol/L (ref 0.5–1.9)

## 2020-05-17 LAB — ECHOCARDIOGRAM COMPLETE
Area-P 1/2: 2.91 cm2
Height: 68 in
S' Lateral: 2.7 cm
Weight: 2272 oz

## 2020-05-17 LAB — HEPARIN LEVEL (UNFRACTIONATED)
Heparin Unfractionated: 0.19 IU/mL — ABNORMAL LOW (ref 0.30–0.70)
Heparin Unfractionated: 0.73 IU/mL — ABNORMAL HIGH (ref 0.30–0.70)
Heparin Unfractionated: 0.35 IU/mL (ref 0.30–0.70)

## 2020-05-17 LAB — RAPID URINE DRUG SCREEN, HOSP PERFORMED
Amphetamines: NOT DETECTED
Barbiturates: NOT DETECTED
Benzodiazepines: NOT DETECTED
Cocaine: POSITIVE — AB
Opiates: NOT DETECTED
Tetrahydrocannabinol: NOT DETECTED

## 2020-05-17 LAB — TROPONIN I (HIGH SENSITIVITY)
Troponin I (High Sensitivity): 535 ng/L (ref <18)
Troponin I (High Sensitivity): 663 ng/L (ref <18)
Troponin I (High Sensitivity): 726 ng/L (ref <18)
Troponin I (High Sensitivity): 700 ng/L (ref <18)
Troponin I (High Sensitivity): 647 ng/L (ref <18)

## 2020-05-17 LAB — HEPATITIS B SURFACE ANTIGEN: Hepatitis B Surface Ag: NONREACTIVE

## 2020-05-17 LAB — FIBRINOGEN: Fibrinogen: 457 mg/dL (ref 210–475)

## 2020-05-17 LAB — LACTATE DEHYDROGENASE: LDH: 281 U/L — ABNORMAL HIGH (ref 98–192)

## 2020-05-17 LAB — VITAMIN B12: Vitamin B-12: 385 pg/mL (ref 180–914)

## 2020-05-17 LAB — D-DIMER, QUANTITATIVE
D-Dimer, Quant: 14.84 ug/mL-FEU — ABNORMAL HIGH (ref 0.00–0.50)
D-Dimer, Quant: 12.51 ug/mL-FEU — ABNORMAL HIGH (ref 0.00–0.50)

## 2020-05-17 LAB — ABO/RH: ABO/RH(D): B POS

## 2020-05-17 LAB — CK: Total CK: 82 U/L (ref 49–397)

## 2020-05-17 LAB — PROCALCITONIN: Procalcitonin: 1.62 ng/mL

## 2020-05-17 LAB — PHOSPHORUS: Phosphorus: 3.4 mg/dL (ref 2.5–4.6)

## 2020-05-17 LAB — FOLATE: Folate: 11.9 ng/mL (ref >5.9)

## 2020-05-17 LAB — BRAIN NATRIURETIC PEPTIDE: B Natriuretic Peptide: 304.3 pg/mL — ABNORMAL HIGH (ref 0.0–100.0)

## 2020-05-17 LAB — HIV ANTIBODY (ROUTINE TESTING W REFLEX): HIV Screen 4th Generation wRfx: NONREACTIVE

## 2020-05-17 LAB — MAGNESIUM
Magnesium: 1.8 mg/dL (ref 1.7–2.4)
Magnesium: 1.9 mg/dL (ref 1.7–2.4)

## 2020-05-17 MED ORDER — ASCORBIC ACID 500 MG PO TABS
500.0000 mg | ORAL_TABLET | Freq: Every day | ORAL | Status: DC
Start: 2020-05-17 — End: 2020-05-20
  Administered 2020-05-17 – 2020-05-20 (×4): 500 mg via ORAL
  Filled 2020-05-17 (×4): qty 1

## 2020-05-17 MED ORDER — IPRATROPIUM-ALBUTEROL 20-100 MCG/ACT IN AERS
1.0000 | INHALATION_SPRAY | Freq: Four times a day (QID) | RESPIRATORY_TRACT | Status: DC
  Administered 2020-05-17 – 2020-05-20 (×10): 1 via RESPIRATORY_TRACT
  Filled 2020-05-17: qty 4

## 2020-05-17 MED ORDER — MAGNESIUM SULFATE 2 GM/50ML IV SOLN
2.0000 g | Freq: Once | INTRAVENOUS | Status: AC
  Administered 2020-05-17: 2 g via INTRAVENOUS
  Filled 2020-05-17: qty 50

## 2020-05-17 MED ORDER — HEPARIN (PORCINE) 25000 UT/250ML-% IV SOLN
1300.0000 [IU]/h | INTRAVENOUS | Status: DC
  Administered 2020-05-17 – 2020-05-19 (×3): 1300 [IU]/h via INTRAVENOUS
  Filled 2020-05-17 (×2): qty 250

## 2020-05-17 MED ORDER — ZINC SULFATE 220 (50 ZN) MG PO CAPS
220.0000 mg | ORAL_CAPSULE | Freq: Every day | ORAL | Status: DC
  Administered 2020-05-17 – 2020-05-20 (×4): 220 mg via ORAL
  Filled 2020-05-17 (×4): qty 1

## 2020-05-17 MED ORDER — METHYLPREDNISOLONE SODIUM SUCC 125 MG IJ SOLR
60.0000 mg | Freq: Two times a day (BID) | INTRAMUSCULAR | Status: DC
  Administered 2020-05-17 – 2020-05-19 (×4): 60 mg via INTRAVENOUS
  Filled 2020-05-17 (×4): qty 2

## 2020-05-17 MED ORDER — HEPARIN (PORCINE) 25000 UT/250ML-% IV SOLN
1350.0000 [IU]/h | INTRAVENOUS | Status: DC
  Administered 2020-05-17: 1350 [IU]/h via INTRAVENOUS
  Filled 2020-05-17: qty 250

## 2020-05-17 MED ORDER — HEPARIN BOLUS VIA INFUSION
2000.0000 [IU] | Freq: Once | INTRAVENOUS | Status: AC
  Administered 2020-05-17: 2000 [IU] via INTRAVENOUS
  Filled 2020-05-17: qty 2000

## 2020-05-17 NOTE — ED Notes (Signed)
Initial contact with pt. Pt is sleeping at bedside easily awakened. Pt is on monitor x4 along with 2L of O2. Pt has no complaints at the moment, will continue to monitor.

## 2020-05-17 NOTE — Consult Note (Signed)
NAMEJayceon Huffman, MRN:  [id number], DOB:  07/24/1960, LOS: 1 ADMISSION DATE:  05/16/2020, CONSULTATION DATE:  05/16/2020  REFERRING MD:  EDP and [facility] MD, CHIEF COMPLAINT:  Submassive PE in setting of covid  Brief History:  Submassive PE with Covid  History of Present Illness:    60 year old thin male . Reports hx of COPD NOS.  He reports prior admissions in 2017/2018 for this.  He is not taking any inhalers.  He is not on oxygen at home.  He is not sure if he needs oxygen at baseline.  He has had some Covid exposure but he thought these people had recovered from Covid.  Then [place] [employer] [place], 2020 he felt dizzy and he nearly fell but was able to gather balance and able to lie down.  At the time the systolic blood pressure was 60 when EMS picked him up but in the emergency department his blood pressure has been normal always been slightly tachycardic and intermittently mildly tachypneic.  He has been on 2 L nasal cannula with pulse ox in the high 90s.  He has been on heparin.  His CT angiogram showed pulmonary embolism distal with RV LV ratio greater than 1.2.  His troponins have been climbing up as seen below.  Pulmonary has been consulted.  He is Covid positive   PESI SCORE at EMS pick up - 129 - class 5 and very high risk (due to male, copd, hypotension and o2 need)  PESI currently -  =- 99 pooints - class 3 due to resolution of hypotension   Contraindications to lysis reviewed   - none apparent  Results for Derrick Huffman, Derrick Huffman (MRN [id number]) as of 05/17/2020 07:35  Ref. Range 05/16/2020 18:33 05/16/2020 20:33 05/17/2020 02:00 05/17/2020 05:40  Troponin I (High Sensitivity) Latest Ref Range: <18 ng/L 272 [facility]) 471 (HH) 535 (HH) 663 [facility])    Results for Derrick Huffman, Derrick Huffman (MRN [id number]) as of 05/17/2020 07:35  Ref. Range 05/17/2020 05:40  LDH Latest Ref Range: 98 - 192 U/L 281 (H)  Troponin I (High Sensitivity) Latest Ref Range: <18 ng/L 663 [facility])  Results for Derrick Huffman, Derrick Huffman (MRN  [id number]) as of 05/17/2020 07:35  Ref. Range 05/17/2020 05:40  D-Dimer, Quant Latest Ref Range: 0.00 - 0.50 ug/mL-FEU 14.84 (H)  Fibrinogen Latest Ref Range: 210 - 475 mg/dL 270  Results for Derrick Huffman, Derrick Huffman (MRN [id number]) as of 05/17/2020 07:35  Ref. Range 05/17/2020 00:09  CRP Latest Ref Range: <1.0 mg/dL 1.3 (H)     Past Medical History:     has a past medical history of COPD (chronic obstructive pulmonary disease) (HCC).   reports that he quit smoking about 4 years ago. He has never used smokeless tobacco.  History reviewed. No pertinent surgical history.  No Known Allergies   There is no immunization history on file for this patient.  Family History  Problem Relation Age of Onset  . GI Disease Other      Current Facility-Administered Medications:  .  0.9 %  sodium chloride infusion, , Intravenous, Continuous, Doutova, Anastassia, MD, Last Rate: 50 mL/hr at 05/17/20 0026, New Bag at 05/17/20 0026 .  acetaminophen (TYLENOL) tablet 650 mg, 650 mg, Oral, Q6H PRN, Doutova, Anastassia, MD .  albuterol (VENTOLIN HFA) 108 (90 Base) MCG/ACT inhaler 2 puff, 2 puff, Inhalation, Q6H, Doutova, Anastassia, MD, 2 puff at 05/17/20 0109 .  dexamethasone (DECADRON) injection 6 mg, 6 mg, Intravenous, Q24H, Doutova, Anastassia, MD, 6 mg at  05/17/20 0655 .  guaiFENesin-dextromethorphan (ROBITUSSIN DM) 100-10 MG/5ML syrup 10 mL, 10 mL, Oral, Q4H PRN, Doutova, Anastassia, MD .  heparin ADULT infusion 100 units/mL (25000 units/271mL), 1,350 Units/hr, Intravenous, Continuous, Poindexter, Leann T, RPH, Last Rate: 13.5 mL/hr at 05/17/20 0654, 1,350 Units/hr at 05/17/20 0654 .  HYDROcodone-acetaminophen (NORCO/VICODIN) 5-325 MG per tablet 1-2 tablet, 1-2 tablet, Oral, Q4H PRN, Doutova, Anastassia, MD .  ondansetron (ZOFRAN) tablet 4 mg, 4 mg, Oral, Q6H PRN **OR** ondansetron (ZOFRAN) injection 4 mg, 4 mg, Intravenous, Q6H PRN, Adela Glimpse, Anastassia, MD .  [address] remdesivir 200 mg in sodium chloride  0.9% 250 mL IVPB, 200 mg, Intravenous, Once, Stopped at 05/17/20 0106 **FOLLOWED BY** remdesivir 100 mg in sodium chloride 0.9 % 100 mL IVPB, 100 mg, Intravenous, Daily, Doutova, Anastassia, MD .  sodium chloride flush (NS) 0.9 % injection 3 mL, 3 mL, Intravenous, Q12H, Doutova, Anastassia, MD  Current Outpatient Medications:  .  sildenafil (VIAGRA) 100 MG tablet, Take 100 mg by mouth daily as needed for erectile dysfunction., Disp: , Rfl:    Significant Hospital Events:  05/16/2020 - admit  Consults:  05/16/2020 -0 ccm consult  Procedures:  x  Significant Diagnostic Tests:  CTA 05/16/2020\  Micro Data:  05/16/2020\ - covid positive  Antimicrobials:   Anti-infectives (From admission, onward)   Start     Dose/Rate Route Frequency Ordered Stop   05/17/20 1000  remdesivir 100 mg in sodium chloride 0.9 % 100 mL IVPB       "Followed by" Linked Group Details   100 mg 200 mL/hr over 30 Minutes Intravenous Daily 05/16/20 2340 05/21/20 0959   05/16/20 2345  remdesivir 200 mg in sodium chloride 0.9% 250 mL IVPB       "Followed by" Linked Group Details   200 mg 580 mL/hr over 30 Minutes Intravenous Once 05/16/20 2340 05/17/20 0106        Interim History / Subjective:   05/17/2020 - seen in ER [place] bed 10  Objective   Blood pressure (!) 126/93, pulse 93, temperature (!) 97.4 F (36.3 C), temperature source Oral, resp. rate 18, height 5\' 8"  (1.727 m), weight 64.4 kg, SpO2 99 %.       No intake or output data in the 24 hours ending 05/17/20 0733 Filed Weights   05/16/20 1755  Weight: 64.4 kg    Examination: General: frail male in bed. Looks "well" HENT: No neck nodes. No elevated JVP Lungs: CTA bilaterlly. Mild barrel chest Cardiovascular: normal heart sound Abdomen: soft Extremities: no cyanosis. No clubbing . No dema Neuro: intact. Aoxox GU: not examined  Resolved Hospital Problem list   x  Assessment & Plan:  Acute Pulmnary embolism   - he presented as high risk  with pre-syncope and hypotension  Thought currently BP/HR stable  =- d-dimer > 14  - on 2L   - has copd nos suggesting ppor CP reserve  - has covid - highly thrombogenic  - seeems to have rising trop  - BUT    - feeling better since arrival - PESI improved from class 5 to class 3  Plan - continue IV heparin - check bnp, lactate and  recheck trop -> if trending up definitely lyse - still low threshold to lyse  - needs strict bed rest - await echo - try to get it stat  - check duplex LE   Discussed pro/con of lysis now versus rescue therapy -> he is willing to consider it but wanted me to talk to  significant other about lysis. -> asked me to talk to his significant other -> I spolke to her Toniann Fail -> she is in favor or early lysis 05/17/2020  Instead of rescue therapy due to face he is highly thrombogenic with covid and has had hypotension and has copd. She undersstands risk for bleeding  Will await troponin and echo  Low Mag < 2gm%  Plan   - replete   Best practice (evaluated daily)    Per [facility]  Goals of Care:   Full code     ATTESTATION & SIGNATURE   The patient Delos Klich is critically ill with multiple organ systems failure and requires high complexity decision making for assessment and support, frequent evaluation and titration of therapies, application of advanced monitoring technologies and extensive interpretation of multiple databases.   Critical Care Time devoted to patient care services described in this note is  60  Minutes. This time reflects time of care of this signee Dr Kalman Shan. This critical care time does not reflect procedure time, or teaching time or supervisory time of PA/NP/Med student/Med Resident etc but could involve care discussion time     Dr. Kalman Shan, M.D., [facility].C.P Pulmonary and Critical Care Medicine Staff Physician [facility] [place] [facility] Pager: [phone], If no answer or  between  15:00h - 7:00h: call [phone]  05/17/2020 7:34 AM     LABS    PULMONARY No results for input(s): PHART, PCO2ART, PO2ART, HCO3, TCO2, O2SAT in the last 168 hours.  Invalid input(s): PCO2, PO2  CBC Recent Labs  Lab 05/16/20 1842  HGB 9.8*  HCT 27.9*  WBC 21.8*  PLT 147*    COAGULATION No results for input(s): INR in the last 168 hours.  CARDIAC  No results for input(s): TROPONINI in the last 168 hours. No results for input(s): PROBNP in the last 168 hours.   CHEMISTRY Recent Labs  Lab 05/16/20 1842 05/17/20 0540  NA 141 138  K 4.4 4.4  CL 114* 110  CO2 21* 21*  GLUCOSE 133* 148*  BUN 15 15  CREATININE 0.89 0.97  CALCIUM 7.9* 8.6*  MG  --  1.8   Estimated Creatinine Clearance: 74.7 mL/min (by C-G formula based on SCr of 0.97 mg/dL).   LIVER Recent Labs  Lab 05/16/20 1842 05/17/20 0540  AST 51* 33  ALT 22 23  ALKPHOS 57 67  BILITOT 3.5* 1.2  PROT 6.7 6.8  ALBUMIN 3.0* 3.2*     INFECTIOUS No results for input(s): LATICACIDVEN, PROCALCITON in the last 168 hours.   ENDOCRINE CBG (last 3)  No results for input(s): GLUCAP in the last 72 hours.       IMAGING x48h  - image(s) personally visualized  -   highlighted in bold CT Angio Chest PE W and/or Wo Contrast  Result Date: 05/16/2020 CLINICAL DATA:  Pulmonary embolus suspected with high probability. Syncopal episode. Generalized weakness, shortness of breath, cough, and diarrhea. EXAM: CT ANGIOGRAPHY CHEST WITH CONTRAST TECHNIQUE: Multidetector CT imaging of the chest was performed using the standard protocol during bolus administration of intravenous contrast. Multiplanar CT image reconstructions and MIPs were obtained to evaluate the vascular anatomy. CONTRAST:  OMNIPAQUE IOHEXOL 350 MG/ML SOLN COMPARISON:  Chest radiograph 05/16/2020 FINDINGS: Cardiovascular: Motion artifact limits examination. There is however moderately good visualization of the central and segmental  pulmonary arteries. Multiple filling defects are demonstrated in the distal main pulmonary arteries and in multiple upper  lobe and lower lobe segmental branches consistent with multiple acute pulmonary emboli. The RV to LV ratio is 1.2, suggesting evidence for right heart strain. No pericardial effusions. Normal caliber thoracic aorta. No aortic dissection. Great vessel origins are patent. Mediastinum/Nodes: Small esophageal hiatal hernia. Esophagus is decompressed. No significant lymphadenopathy. Lungs/Pleura: Mild atelectasis in the right lung base. Lungs are otherwise clear. No pleural effusions. No pneumothorax. Airways are patent. Upper Abdomen: No acute abnormalities demonstrated in the visualized upper abdomen. Probable gallstones incompletely evaluated. Musculoskeletal: Degenerative changes in the spine and shoulders. Review of the MIP images confirms the above findings. IMPRESSION: 1. Multiple bilateral acute pulmonary emboli. CT evidence of right heart strain (RV/LV Ratio 1.2) consistent with at least submassive (intermediate risk) PE. The presence of right heart strain has been associated with an increased risk of morbidity and mortality. 2. Small esophageal hiatal hernia. 3. Probable gallstones. Critical Value/emergent results were called by telephone at the time of interpretation on 05/16/2020 at 10:51 pm to provider JOSEPH ZAMMIT , who verbally acknowledged these results. Electronically Signed   By: Burman NievesWilliam  Stevens M.D.   On: 05/16/2020 22:54   DG Chest Port 1 View  Result Date: 05/16/2020 CLINICAL DATA:  Syncopal episode EXAM: PORTABLE CHEST 1 VIEW COMPARISON:  07/29/2019 FINDINGS: There are advanced degenerative changes of the right glenohumeral joint. The heart size is stable from prior study. There is no pneumothorax. No large pleural effusion. No focal infiltrate. IMPRESSION: No active disease. Electronically Signed   By: Katherine Mantlehristopher  Green M.D.   On: 05/16/2020 19:02

## 2020-05-17 NOTE — ED Notes (Signed)
Rate change done via heparin ( see MAR). Pt sleeping at bedside but easily awakened. Pt has no complaints at the moment will continue to monitor.

## 2020-05-17 NOTE — Progress Notes (Signed)
PROGRESS NOTE    Derrick Huffman  HYQ:[id number] DOB: Oct 11, 1960 DOA: 05/16/2020 PCP: Derrick Ghazi, MD     Brief Narrative:  Derrick Huffman is a 60 y.o. BM PMHx COPD    Presented with   Syncope and shortness of breath Patient was brought in by EMS from [facility] at [facility].  Where he was found to have a syncopal episode he reported some generalized fatigue shortness of breath cough diarrhea for past 1 week. On EMS arrival his blood pressures was in the 60s.  Denied any head injury. Apparently he was gradually assisted to the ground by his girlfriend Reports he does not smoke he quit when he was diagnosis of COPD  states that he drinks only on occasion   Subjective: A/O x4, states never fully passed out felt presyncopal and lowered himself to the floor secondary to positive S OB and lightheadedness.  Has never happened in the past.  States does have COPD but not on home O2   Assessment & Plan: Covid vaccination; vaccinated 2/3   Active Problems:   Pulmonary embolism ([place])   COVID-19 virus infection   Syncope and collapse   Sepsis due to pneumonia ([place])   Pneumonia due to COVID-19 virus   Acute respiratory failure with hypoxia ([place])   Anemia, unspecified   Postural dizziness with presyncope   Acute diastolic CHF (congestive heart failure) ([place])   Sepsis due to pneumonia -On admission patient met criteria for sepsis HR>90, RR>20, site of infection lungs  Acute diastolic CHF -Strict in and out -Daily weight  Presyncope -Most likely secondary to acute PE, and Covid infection.  Acute PE -Most likely secondary to hypercoagulable state from Covid infection -CTA PE protocol; shows PE with right heart strain see results below -1/23 stat echocardiogram pending -1/23 Dr. Chase Caller PCCM aware of case standing by for results of echocardiogram, may require thrombolytics. -Trend troponin sensitive -1/23 stat bilateral lower extremity Doppler Pending -Heparin  drip -ADDENDUM; after reviewing echocardiogram we will hold on thrombolytics if patient becomes unstable we will revisit use of thrombolytics.  Covid infection -DG chest/CTA PE protocol without evidence of pulmonary infiltrates -Patient hypoxic most likely secondary to large bilateral PE -Remdesivir per pharmacy protocol -Solu-Medrol 60 mg BID -Vitamins per Covid protocol -Combivent QID -Incentive spirometry -Flutter valve -Prone patient 16 hours/day.  If cannot tolerate prone 2 to 3 hours per shift  Anemia unspecified -Anemia panel pending -Hemoccult pending    DVT prophylaxis: Heparin drip Code Status: Full Family Communication:  Status is: Inpatient    Dispo: The patient is from: Home              Anticipated d/c is to: Home              Anticipated d/c date is: 1/30              Patient currently unstable      Consultants:  PCCM   Procedures/Significant Events:  1/23 CTA PE protocol:-Multiple bilateral acute pulmonary emboli.  -CT evidence of right heart strain (RV/LV Ratio 1.2) consistent with at least submassive (intermediate risk) PE.  -Small esophageal hiatal hernia. -Probable gallstones. 1/24 echocardiogram,Left Ventricle: LVEF= 55 to 60%.  -Grade I diastolic dysfunction (impaired relaxation).       I have personally reviewed and interpreted all radiology studies and my findings are as above.  VENTILATOR SETTINGS: Nasal cannula 1/24 Flow 2 L/min SPO2 99%   Cultures   Antimicrobials: Anti-infectives (From admission, onward)   Start  Dose/Rate Route Frequency Ordered Stop   05/17/20 1000  remdesivir 100 mg in sodium chloride 0.9 % 100 mL IVPB       "Followed by" Linked Group Details   100 mg 200 mL/hr over 30 Minutes Intravenous Daily 05/16/20 2340 05/21/20 0959   05/16/20 2345  remdesivir 200 mg in sodium chloride 0.9% 250 mL IVPB       "Followed by" Linked Group Details   200 mg 580 mL/hr over 30 Minutes Intravenous Once 05/16/20  2340 05/17/20 0106       Devices    LINES / TUBES:      Continuous Infusions: . heparin 1,300 Units/hr (05/17/20 1524)  . remdesivir 100 mg in NS 100 mL Stopped (05/17/20 1350)     Objective: Vitals:   05/17/20 1622 05/17/20 1745 05/17/20 1754 05/17/20 1822  BP: 107/76  123/87 116/78  Pulse: 85 (!) 105  (!) 101  Resp: (!) 24 (!) 22  (!) 21  Temp:   (!) 97.5 F (36.4 C) 98.3 F (36.8 C)  TempSrc:   Oral Oral  SpO2: 97% 98%  98%  Weight:      Height:        Intake/Output Summary (Last 24 hours) at 05/17/2020 1837 Last data filed at 05/17/2020 1522 Gross per 24 hour  Intake 132.67 ml  Output --  Net 132.67 ml   Filed Weights   05/16/20 1755  Weight: 64.4 kg    Examination:  General: A/O x4, positive acute respiratory distress, cachectic Eyes: negative scleral hemorrhage, negative anisocoria, negative icterus ENT: Negative Runny nose, negative gingival bleeding, Neck:  Negative scars, masses, torticollis, lymphadenopathy, JVD Lungs: Clear to auscultation bilaterally without wheezes or crackles Cardiovascular: Regular rate and rhythm without murmur gallop or rub normal S1 and S2 Abdomen: negative abdominal pain, nondistended, positive soft, bowel sounds, no rebound, no ascites, no appreciable mass Extremities: No significant cyanosis, clubbing, or edema bilateral lower extremities Skin: Negative rashes, lesions, ulcers Psychiatric:  Negative depression, negative anxiety, negative fatigue, negative mania  Central nervous system:  Cranial nerves II through XII intact, tongue/uvula midline, all extremities muscle strength 5/5, sensation intact throughout, negative dysarthria, negative expressive aphasia, negative receptive aphasia.  .     Data Reviewed: Care during the described time interval was provided by me .  I have reviewed this patient's available data, including medical history, events of note, physical examination, and all test results as part of my  evaluation.  CBC: Recent Labs  Lab 05/16/20 1842 05/17/20 0818  WBC 21.8* 11.0*  NEUTROABS 18.5* 8.2*  HGB 9.8* 9.2*  HCT 27.9* 26.2*  MCV 84.3 83.2  PLT 147* 340*   Basic Metabolic Panel: Recent Labs  Lab 05/16/20 1842 05/17/20 0540 05/17/20 0818  NA 141 138 141  K 4.4 4.4 4.2  CL 114* 110 112*  CO2 21* 21* 22  GLUCOSE 133* 148* 140*  BUN '15 15 13  ' CREATININE 0.89 0.97 0.90  CALCIUM 7.9* 8.6* 8.9  MG  --  1.8 1.9  PHOS  --   --  3.4   GFR: Estimated Creatinine Clearance: 80.5 mL/min (by C-G formula based on SCr of 0.9 mg/dL). Liver Function Tests: Recent Labs  Lab 05/16/20 1842 05/17/20 0540 05/17/20 0818  AST 51* 33 27  ALT '22 23 22  ' ALKPHOS 57 67 62  BILITOT 3.5* 1.2 1.3*  PROT 6.7 6.8 6.9  ALBUMIN 3.0* 3.2* 3.3*   No results for input(s): LIPASE, AMYLASE in the last 168 hours. No  results for input(s): AMMONIA in the last 168 hours. Coagulation Profile: No results for input(s): INR, PROTIME in the last 168 hours. Cardiac Enzymes: Recent Labs  Lab 05/17/20 0200  CKTOTAL 82   BNP (last 3 results) No results for input(s): PROBNP in the last 8760 hours. HbA1C: No results for input(s): HGBA1C in the last 72 hours. CBG: No results for input(s): GLUCAP in the last 168 hours. Lipid Profile: No results for input(s): CHOL, HDL, LDLCALC, TRIG, CHOLHDL, LDLDIRECT in the last 72 hours. Thyroid Function Tests: No results for input(s): TSH, T4TOTAL, FREET4, T3FREE, THYROIDAB in the last 72 hours. Anemia Panel: Recent Labs    05/17/20 0009 05/17/20 0131 05/17/20 1131  VITAMINB12  --  385  --   FOLATE  --  11.9  --   FERRITIN 74  --  67  TIBC  --  296  --   IRON  --  41*  --   RETICCTPCT  --  7.6*  --    Sepsis Labs: Recent Labs  Lab 05/17/20 0540 05/17/20 1132 05/17/20 1350  PROCALCITON 1.62  --   --   LATICACIDVEN  --  1.2 1.5    Recent Results (from the past 240 hour(s))  SARS Coronavirus 2 by RT PCR (hospital order, performed in [facility] hospital lab) Nasopharyngeal Nasopharyngeal Swab     Status: Abnormal   Collection Time: 05/16/20  6:23 PM   Specimen: Nasopharyngeal Swab  Result Value Ref Range Status   SARS Coronavirus 2 POSITIVE (A) NEGATIVE Final    Comment: RESULT CALLED TO, READ BACK BY AND VERIFIED WITH: [place] RN 05/16/20 '@2109'  BY P.HENDERSON (NOTE) SARS-CoV-2 target nucleic acids are DETECTED  SARS-CoV-2 RNA is generally detectable in upper respiratory specimens  during the acute phase of infection.  Positive results are indicative  of the presence of the identified virus, but do not rule out bacterial infection or co-infection with other pathogens not detected by the test.  Clinical correlation with patient history and  other diagnostic information is necessary to determine patient infection status.  The expected result is negative.  Fact Sheet for Patients:   StrictlyIdeas.no   Fact Sheet for Healthcare Providers:   BankingDealers.co.za    This test is not yet approved or cleared by the [place] FDA and  has been authorized for detection and/or diagnosis of SARS-CoV-2 by FDA under an Emergency Use Authorization (EUA).  This EUA will remain in effect (m eaning this test can be used) for the duration of  the COVID-19 declaration under Section 564(b)(1) of the Act, 21 U.S.C. section 360-bbb-3(b)(1), unless the authorization is terminated or revoked sooner.  Performed at [facility], [place] [address], [place], [place] [address]   Culture, blood (Routine X 2) w Reflex to ID Panel     Status: None (Preliminary result)   Collection Time: 05/16/20 11:37 PM   Specimen: BLOOD  Result Value Ref Range Status   Specimen Description   Final    BLOOD RIGHT ANTECUBITAL Performed at [facility], [place] [address] [place], [place] [address]    Special Requests   Final    BOTTLES DRAWN AEROBIC AND ANAEROBIC Blood Culture  adequate volume Performed at [place] [address], [place], [place] [address]    Culture PENDING  Incomplete   Report Status PENDING  Incomplete  Culture, blood (Routine X 2) w Reflex to ID Panel     Status: None (Preliminary result)  Collection Time: 05/16/20 11:42 PM   Specimen: BLOOD  Result Value Ref Range Status   Specimen Description   Final    BLOOD LEFT ANTECUBITAL Performed at [facility], [place] [address], [place], [place] [address]    Special Requests   Final    BOTTLES DRAWN AEROBIC AND ANAEROBIC Blood Culture adequate volume Performed at [place] [address] [place],  [address]    Culture PENDING  Incomplete   Report Status PENDING  Incomplete         Radiology Studies: CT Angio Chest PE W and/or Wo Contrast  Result Date: 05/16/2020 CLINICAL DATA:  Pulmonary embolus suspected with high probability. Syncopal episode. Generalized weakness, shortness of breath, cough, and diarrhea. EXAM: CT ANGIOGRAPHY CHEST WITH CONTRAST TECHNIQUE: Multidetector CT imaging of the chest was performed using the standard protocol during bolus administration of intravenous contrast. Multiplanar CT image reconstructions and MIPs were obtained to evaluate the vascular anatomy. CONTRAST:  127m OMNIPAQUE IOHEXOL 350 MG/ML SOLN COMPARISON:  Chest radiograph 05/16/2020 FINDINGS: Cardiovascular: Motion artifact limits examination. There is however moderately good visualization of the central and segmental pulmonary arteries. Multiple filling defects are demonstrated in the distal main pulmonary arteries and in multiple upper lobe and lower lobe segmental branches consistent with multiple acute pulmonary emboli. The RV to LV ratio is 1.2, suggesting evidence for right heart strain. No pericardial effusions. Normal caliber thoracic aorta. No aortic dissection. Great vessel origins are patent. Mediastinum/Nodes: Small esophageal hiatal  hernia. Esophagus is decompressed. No significant lymphadenopathy. Lungs/Pleura: Mild atelectasis in the right lung base. Lungs are otherwise clear. No pleural effusions. No pneumothorax. Airways are patent. Upper Abdomen: No acute abnormalities demonstrated in the visualized upper abdomen. Probable gallstones incompletely evaluated. Musculoskeletal: Degenerative changes in the spine and shoulders. Review of the MIP images confirms the above findings. IMPRESSION: 1. Multiple bilateral acute pulmonary emboli. CT evidence of right heart strain (RV/LV Ratio 1.2) consistent with at least submassive (intermediate risk) PE. The presence of right heart strain has been associated with an increased risk of morbidity and mortality. 2. Small esophageal hiatal hernia. 3. Probable gallstones. Critical Value/emergent results were called by telephone at the time of interpretation on 05/16/2020 at 10:51 pm to provider JOSEPH ZAMMIT , who verbally acknowledged these results. Electronically Signed   By: WLucienne CapersM.D.   On: 05/16/2020 22:54   DG Chest Port 1 View  Result Date: 05/16/2020 CLINICAL DATA:  Syncopal episode EXAM: PORTABLE CHEST 1 VIEW COMPARISON:  07/29/2019 FINDINGS: There are advanced degenerative changes of the right glenohumeral joint. The heart size is stable from prior study. There is no pneumothorax. No large pleural effusion. No focal infiltrate. IMPRESSION: No active disease. Electronically Signed   By: CConstance HolsterM.D.   On: 05/16/2020 19:02   ECHOCARDIOGRAM COMPLETE  Result Date: 05/17/2020    ECHOCARDIOGRAM REPORT   Patient Name:   JBRODERIC BARADate of Exam: 05/17/2020 Medical Rec #:  [id number]    Height:       68.0 in Accession #:    [id number]   Weight:       142.0 lb Date of Birth:  708-18-1962    BSA:          1.767 m Patient Age:    89years      BP:           120/89 mmHg Patient Gender: M  HR:           93 bpm. Exam Location:  Inpatient Procedure: 2D Echo, Cardiac  Doppler and Color Doppler STAT ECHO Indications:    I50.23 Acute on chronic systolic (congestive) heart failure  History:        Patient has no prior history of Echocardiogram examinations.                 COPD. COVID-19 Positive.  Sonographer:    Jonelle Sidle Dance Referring Phys: [id number] [place]  1. Left ventricular ejection fraction, by estimation, is 55 to 60%. The left ventricle has normal function. The left ventricle has no regional wall motion abnormalities. Left ventricular diastolic parameters are consistent with Grade I diastolic dysfunction (impaired relaxation).  2. Right ventricular systolic function is normal. The right ventricular size is mildly enlarged. There is normal pulmonary artery systolic pressure. The estimated right ventricular systolic pressure is 46.2 mmHg.  3. The mitral valve is grossly normal. Trivial mitral valve regurgitation.  4. The aortic valve is tricuspid. There is mild calcification of the aortic valve. There is mild thickening of the aortic valve. Aortic valve regurgitation is trivial.  5. The inferior vena cava is normal in size with greater than 50% respiratory variability, suggesting right atrial pressure of 3 mmHg. Comparison(s): No prior Echocardiogram. FINDINGS  Left Ventricle: Left ventricular ejection fraction, by estimation, is 55 to 60%. The left ventricle has normal function. The left ventricle has no regional wall motion abnormalities. The left ventricular internal cavity size was normal in size. There is  no left ventricular hypertrophy. Left ventricular diastolic parameters are consistent with Grade I diastolic dysfunction (impaired relaxation). Right Ventricle: The right ventricular size is mildly enlarged. No increase in right ventricular wall thickness. Right ventricular systolic function is normal. There is normal pulmonary artery systolic pressure. The tricuspid regurgitant velocity is 2.17  m/s, and with an assumed right atrial pressure of 3  mmHg, the estimated right ventricular systolic pressure is 70.3 mmHg. Left Atrium: Left atrial size was normal in size. Right Atrium: Right atrial size was normal in size. Pericardium: There is no evidence of pericardial effusion. Mitral Valve: The mitral valve is grossly normal. There is mild thickening of the mitral valve leaflet(s). Trivial mitral valve regurgitation. Tricuspid Valve: The tricuspid valve is normal in structure. Tricuspid valve regurgitation is trivial. Aortic Valve: The aortic valve is tricuspid. There is mild calcification of the aortic valve. There is mild thickening of the aortic valve. Aortic valve regurgitation is trivial. Pulmonic Valve: The pulmonic valve was normal in structure. Pulmonic valve regurgitation is trivial. Aorta: The aortic root and ascending aorta are structurally normal, with no evidence of dilitation. Venous: The inferior vena cava is normal in size with greater than 50% respiratory variability, suggesting right atrial pressure of 3 mmHg. IAS/Shunts: No atrial level shunt detected by color flow Doppler.  LEFT VENTRICLE PLAX 2D LVIDd:         3.80 cm  Diastology LVIDs:         2.70 cm  LV e' medial:    107.22 cm/s LV PW:         1.05 cm  LV E/e' medial:  0.6 LV IVS:        0.80 cm  LV e' lateral:   9.36 cm/s LVOT diam:     2.20 cm  LV E/e' lateral: 7.3 LV SV:         71 LV SV Index:   40 LVOT  Area:     3.80 cm  RIGHT VENTRICLE            IVC RV Basal diam:  3.10 cm    IVC diam: 2.00 cm RV Mid diam:    2.70 cm RV S prime:     9.90 cm/s TAPSE (M-mode): 2.0 cm LEFT ATRIUM           Index       RIGHT ATRIUM           Index LA diam:      3.00 cm 1.70 cm/m  RA Area:     13.05 cm LA Vol (A2C): 53.6 ml 30.34 ml/m RA Volume:   33.25 ml  18.82 ml/m LA Vol (A4C): 22.2 ml 12.56 ml/m  AORTIC VALVE LVOT Vmax:   108.00 cm/s LVOT Vmean:  69.200 cm/s LVOT VTI:    0.187 m  AORTA Ao Root diam: 3.40 cm Ao Asc diam:  3.50 cm MITRAL VALVE               TRICUSPID VALVE MV Area (PHT): 2.91  cm    TR Peak grad:   18.8 mmHg MV Decel Time: 261 msec    TR Vmax:        217.00 cm/s MV E velocity: 68.30 cm/s MV A velocity: 96.10 cm/s  SHUNTS MV E/A ratio:  0.71        Systemic VTI:  0.19 m                            Systemic Diam: 2.20 cm Gwyndolyn Kaufman MD Electronically signed by Gwyndolyn Kaufman MD Signature Date/Time: 05/17/2020/2:02:00 PM    Final    VAS US CAROTID  Result Date: 05/17/2020 Carotid Arterial Duplex Study Indications:       Syncope. Other Factors:     COVID 19 positive. Comparison Study:  No prior studies. Performing Technologist: Oliver Hum RVT  Examination Guidelines: A complete evaluation includes B-mode imaging, spectral Doppler, color Doppler, and power Doppler as needed of all accessible portions of each vessel. Bilateral testing is considered an integral part of a complete examination. Limited examinations for reoccurring indications may be performed as noted.  Right Carotid Findings: +----------+--------+--------+--------+-----------------------+--------+           PSV cm/sEDV cm/sStenosisPlaque Description     Comments +----------+--------+--------+--------+-----------------------+--------+ CCA Prox  55      9               smooth and heterogenoustortuous +----------+--------+--------+--------+-----------------------+--------+ CCA Distal77      25              smooth and heterogenous         +----------+--------+--------+--------+-----------------------+--------+ ICA Prox  53      18              smooth and heterogenous         +----------+--------+--------+--------+-----------------------+--------+ ICA Distal75      34                                     tortuous +----------+--------+--------+--------+-----------------------+--------+ ECA       59      13                                              +----------+--------+--------+--------+-----------------------+--------+  +----------+--------+-------+--------+-------------------+  PSV cm/sEDV cmsDescribeArm Pressure (mmHG) +----------+--------+-------+--------+-------------------+ Subclavian40                                         +----------+--------+-------+--------+-------------------+ +---------+--------+--+--------+--+---------+ VertebralPSV cm/s46EDV cm/s20Antegrade +---------+--------+--+--------+--+---------+  Left Carotid Findings: +----------+--------+--------+--------+-----------------------+--------+           PSV cm/sEDV cm/sStenosisPlaque Description     Comments +----------+--------+--------+--------+-----------------------+--------+ CCA Prox  67      21              smooth and heterogenous         +----------+--------+--------+--------+-----------------------+--------+ CCA Distal81      28              smooth and heterogenous         +----------+--------+--------+--------+-----------------------+--------+ ICA Prox  58      23                                              +----------+--------+--------+--------+-----------------------+--------+ ICA Distal54      27                                     tortuous +----------+--------+--------+--------+-----------------------+--------+ ECA       74      17                                              +----------+--------+--------+--------+-----------------------+--------+ +----------+--------+--------+--------+-------------------+           PSV cm/sEDV cm/sDescribeArm Pressure (mmHG) +----------+--------+--------+--------+-------------------+ [id number]                                          +----------+--------+--------+--------+-------------------+ +---------+--------+--+--------+--+---------+ VertebralPSV cm/s37EDV cm/s19Antegrade +---------+--------+--+--------+--+---------+   Summary: Right Carotid: Velocities in the right ICA are consistent with a 1-39% stenosis. Left Carotid:  Velocities in the left ICA are consistent with a 1-39% stenosis. Vertebrals: Bilateral vertebral arteries demonstrate antegrade flow. *See table(s) above for measurements and observations.  Electronically signed by Ruta Hinds MD on 05/17/2020 at 6:18:52 PM.    Final    VAS [place] LOWER EXTREMITY VENOUS (DVT)  Result Date: 05/17/2020  Lower Venous DVT Study Indications: Pulmonary embolism.  Risk Factors: COVID 19 positive. Comparison Study: No prior studies. Performing Technologist: Oliver Hum RVT  Examination Guidelines: A complete evaluation includes B-mode imaging, spectral Doppler, color Doppler, and power Doppler as needed of all accessible portions of each vessel. Bilateral testing is considered an integral part of a complete examination. Limited examinations for reoccurring indications may be performed as noted. The reflux portion of the exam is performed with the patient in reverse Trendelenburg.  +---------+---------------+---------+-----------+----------+--------------+ RIGHT    CompressibilityPhasicitySpontaneityPropertiesThrombus Aging +---------+---------------+---------+-----------+----------+--------------+ CFV      Full           Yes      Yes                                 +---------+---------------+---------+-----------+----------+--------------+ SFJ  Full                                                        +---------+---------------+---------+-----------+----------+--------------+ FV Prox  Full                                                        +---------+---------------+---------+-----------+----------+--------------+ FV Mid   Full                                                        +---------+---------------+---------+-----------+----------+--------------+ FV DistalFull                                                        +---------+---------------+---------+-----------+----------+--------------+ PFV      Full                                                         +---------+---------------+---------+-----------+----------+--------------+ POP      None           No       No                   Acute          +---------+---------------+---------+-----------+----------+--------------+ PTV      Partial                                      Acute          +---------+---------------+---------+-----------+----------+--------------+ PERO     None                                         Acute          +---------+---------------+---------+-----------+----------+--------------+ Gastroc  None                                         Acute          +---------+---------------+---------+-----------+----------+--------------+   +---------+---------------+---------+-----------+----------+--------------+ LEFT     CompressibilityPhasicitySpontaneityPropertiesThrombus Aging +---------+---------------+---------+-----------+----------+--------------+ CFV      Full           Yes      Yes                                 +---------+---------------+---------+-----------+----------+--------------+ SFJ      Full                                                        +---------+---------------+---------+-----------+----------+--------------+  FV Prox  Full                                                        +---------+---------------+---------+-----------+----------+--------------+ FV Mid   Full                                                        +---------+---------------+---------+-----------+----------+--------------+ FV DistalFull                                                        +---------+---------------+---------+-----------+----------+--------------+ PFV      Full                                                        +---------+---------------+---------+-----------+----------+--------------+ POP      Full           Yes      Yes                                  +---------+---------------+---------+-----------+----------+--------------+ PTV      Full                                                        +---------+---------------+---------+-----------+----------+--------------+ PERO     Full                                                        +---------+---------------+---------+-----------+----------+--------------+     Summary: RIGHT: - Findings consistent with acute deep vein thrombosis involving the right popliteal vein, right posterior tibial veins, right peroneal veins, and right gastrocnemius veins. - No cystic structure found in the popliteal fossa.  LEFT: - There is no evidence of deep vein thrombosis in the lower extremity.  - No cystic structure found in the popliteal fossa.  *See table(s) above for measurements and observations. Electronically signed by Ruta Hinds MD on 05/17/2020 at 6:17:57 PM.    Final         Scheduled Meds: . vitamin C  500 mg Oral Daily  . Ipratropium-Albuterol  1 puff Inhalation QID  . methylPREDNISolone (SOLU-MEDROL) injection  60 mg Intravenous Q12H  . sodium chloride flush  3 mL Intravenous Q12H  . zinc sulfate  220 mg Oral Daily   Continuous Infusions: . heparin 1,300 Units/hr (05/17/20 1524)  . remdesivir 100 mg in NS 100 mL Stopped (05/17/20 1350)     LOS: 1 day    Time  spent:40 min    Tiquan Bouch, Geraldo Docker, MD [facility] Pager [phone]  If 7PM-7AM, please contact night-coverage [email or web address] Password [facility] 05/17/2020, 6:37 PM

## 2020-05-17 NOTE — ED Notes (Signed)
Attempted to call report; RN not available at this time, will try again soon.

## 2020-05-17 NOTE — Progress Notes (Signed)
  Echocardiogram 2D Echocardiogram has been performed.  Tiffany G Dance 05/17/2020, 11:45 AM

## 2020-05-17 NOTE — Progress Notes (Signed)
Carotid artery duplex and bilateral lower extremity venous duplex has been completed. Preliminary results can be found in CV Proc through chart review.  Results were given to the patient's nurse, Chelsea.  05/17/20 12:06 PM Olen Cordial RVT

## 2020-05-17 NOTE — Progress Notes (Signed)
Brief Pharmacy Anti-Coagulation Note:  For full details see note from Bernadene Person Pharm D from earlier today  A/P: Heparin level 0.35 therapeutic on 1300 units/hr No bleeding noted Heparin level with am labs  Arley Phenix RPh 05/17/2020, 10:48 PM

## 2020-05-17 NOTE — ED Notes (Signed)
VASCULAR AT BEDSIDE

## 2020-05-17 NOTE — ED Notes (Signed)
ECHO at bedside.

## 2020-05-17 NOTE — ED Notes (Signed)
Admission MD at bedside.  

## 2020-05-17 NOTE — Progress Notes (Signed)
    Patient doing stable On 2L [place]  Trop tredning down Lactate normal ECHO -  Right Ventricle: The right ventricular size is mildly enlarged. No  increase in right ventricular wall thickness. Right ventricular systolic  function is normal. There is normal pulmonary artery systolic pressure.  The tricuspid regurgitant velocity is 2.17  m/s, and with an assumed right atrial pressure of 3 mmHg, the estimated  right ventricular systolic pressure is 21.8 mmHg.    Results for JALAL, RAUCH (MRN [id number]) as of 05/17/2020 13:36  Ref. Range 05/17/2020 11:31 05/17/2020 11:32 05/17/2020 11:45 05/17/2020 12:05 05/17/2020 12:06  Troponin I (High Sensitivity) Latest Ref Range: <18 ng/L  [facility])       D/w Dr Donivan Scull and updated significant other  Plan  - hold off on lyssis  - IV heparin gtt  - lysis as rescue    SIGNATURE    Dr. Kalman Shan, M.D., F.C.C.P,  Pulmonary and Critical Care Medicine Staff Physician, [facility] Director - Interstitial Lung Disease  Program  Pulmonary Fibrosis [facility] at [facility] [place], [place], [address]  Pager: [phone], If no answer  OR between  19:00-7:00h: page [phone] Telephone (clinical office): [phone] Telephone (research): [phone]  2:47 PM 05/17/2020

## 2020-05-17 NOTE — Progress Notes (Signed)
ANTICOAGULATION CONSULT NOTE  Pharmacy Consult for Heparin Indication: pulmonary embolus  No Known Allergies  Patient Measurements: Height: 5\' 8"  (172.7 cm) Weight: 64.4 kg (142 lb) IBW/kg (Calculated) : 68.4 Heparin Dosing Weight: total body weight  Vital Signs: Temp: 98 F (36.7 C) (01/24 0817) Temp Source: Oral (01/24 0817) BP: 129/95 (01/24 1300) Pulse Rate: 95 (01/24 1300)  Labs: Recent Labs    05/16/20 1842 05/16/20 2033 05/17/20 0200 05/17/20 0540 05/17/20 0818 05/17/20 1132 05/17/20 1339  HGB 9.8*  --   --   --  9.2*  --   --   HCT 27.9*  --   --   --  26.2*  --   --   PLT 147*  --   --   --  148*  --   --   HEPARINUNFRC  --   --   --  0.19*  --   --  0.73*  CREATININE 0.89  --   --  0.97 0.90  --   --   CKTOTAL  --   --  82  --   --   --   --   TROPONINIHS  --    < > 535* 663* 726* 700*  --    < > = values in this interval not displayed.    Estimated Creatinine Clearance: 80.5 mL/min (by C-G formula based on SCr of 0.9 mg/dL).  Medications:  No anticoagulation PTA  Assessment: 52 yoM presents to ED s/p syncopal episode. PMH significant for COPD. CTAngio shows multiple bilateral PE with evidence of right heart strain. Pharmacy consulted to dose IV heparin. Echo done 1/24 shows much less evidence of RH dilation.   Baseline INR, aPTT: not done  Prior anticoagulation: none  Significant events:  Today, 05/17/2020:  CBC: Hgb low; slightly decreased from yesterday; Plt borderline low but stable  Most recent heparin level slightly supratherapeutic on 1350 units/hr  No bleeding or infusion issues per nursing  Goal of Therapy: Heparin level 0.3-0.7 units/ml Monitor platelets by anticoagulation protocol: Yes  Plan:  Decrease heparin IV infusion to 1300 units/hr  Check heparin level 6 hrs after rate change  Daily CBC, daily heparin level once stable  Monitor for signs of bleeding or thrombosis  05/19/2020, PharmD,  BCPS [phone] 05/17/2020, 2:49 PM

## 2020-05-18 DIAGNOSIS — D538 Other specified nutritional anemias: Secondary | ICD-10-CM | POA: Diagnosis not present

## 2020-05-18 DIAGNOSIS — R55 Syncope and collapse: Secondary | ICD-10-CM | POA: Diagnosis not present

## 2020-05-18 DIAGNOSIS — J9601 Acute respiratory failure with hypoxia: Secondary | ICD-10-CM | POA: Diagnosis not present

## 2020-05-18 DIAGNOSIS — I5031 Acute diastolic (congestive) heart failure: Secondary | ICD-10-CM | POA: Diagnosis not present

## 2020-05-18 LAB — COMPREHENSIVE METABOLIC PANEL
ALT: 24 U/L (ref 0–44)
AST: 35 U/L (ref 15–41)
Albumin: 3.5 g/dL (ref 3.5–5.0)
Alkaline Phosphatase: 62 U/L (ref 38–126)
Anion gap: 10 (ref 5–15)
BUN: 16 mg/dL (ref 6–20)
CO2: 21 mmol/L — ABNORMAL LOW (ref 22–32)
Calcium: 9.4 mg/dL (ref 8.9–10.3)
Chloride: 108 mmol/L (ref 98–111)
Creatinine, Ser: 0.73 mg/dL (ref 0.61–1.24)
GFR, Estimated: >60 mL/min (ref >60)
Glucose, Bld: 137 mg/dL — ABNORMAL HIGH (ref 70–99)
Potassium: 4.9 mmol/L (ref 3.5–5.1)
Sodium: 139 mmol/L (ref 135–145)
Total Bilirubin: 1.1 mg/dL (ref 0.3–1.2)
Total Protein: 7.6 g/dL (ref 6.5–8.1)

## 2020-05-18 LAB — CBC WITH DIFFERENTIAL/PLATELET
Abs Immature Granulocytes: 0.22 10*3/uL — ABNORMAL HIGH (ref 0.00–0.07)
Basophils Absolute: 0 10*3/uL (ref 0.0–0.1)
Basophils Relative: 0 %
Eosinophils Absolute: 0 10*3/uL (ref 0.0–0.5)
Eosinophils Relative: 0 %
HCT: 29.5 % — ABNORMAL LOW (ref 39.0–52.0)
Hemoglobin: 10.4 g/dL — ABNORMAL LOW (ref 13.0–17.0)
Immature Granulocytes: 2 %
Lymphocytes Relative: 9 %
Lymphs Abs: 1.2 10*3/uL (ref 0.7–4.0)
MCH: 29.6 pg (ref 26.0–34.0)
MCHC: 35.3 g/dL (ref 30.0–36.0)
MCV: 84 fL (ref 80.0–100.0)
Monocytes Absolute: 0.8 10*3/uL (ref 0.1–1.0)
Monocytes Relative: 6 %
Neutro Abs: 11.8 10*3/uL — ABNORMAL HIGH (ref 1.7–7.7)
Neutrophils Relative %: 83 %
Platelets: 163 10*3/uL (ref 150–400)
RBC: 3.51 MIL/uL — ABNORMAL LOW (ref 4.22–5.81)
RDW: 16.9 % — ABNORMAL HIGH (ref 11.5–15.5)
WBC: 14.1 10*3/uL — ABNORMAL HIGH (ref 4.0–10.5)
nRBC: 1.7 % — ABNORMAL HIGH (ref 0.0–0.2)

## 2020-05-18 LAB — IRON AND TIBC
Iron: 37 ug/dL — ABNORMAL LOW (ref 45–182)
Saturation Ratios: 11 % — ABNORMAL LOW (ref 17.9–39.5)
TIBC: 352 ug/dL (ref 250–450)
UIBC: 315 ug/dL

## 2020-05-18 LAB — RETICULOCYTES
Immature Retic Fract: 27.7 % — ABNORMAL HIGH (ref 2.3–15.9)
RBC.: 3.46 MIL/uL — ABNORMAL LOW (ref 4.22–5.81)
Retic Count, Absolute: 231.5 10*3/uL — ABNORMAL HIGH (ref 19.0–186.0)
Retic Ct Pct: 6.7 % — ABNORMAL HIGH (ref 0.4–3.1)

## 2020-05-18 LAB — PHOSPHORUS: Phosphorus: 3.7 mg/dL (ref 2.5–4.6)

## 2020-05-18 LAB — FOLATE: Folate: 10 ng/mL (ref >5.9)

## 2020-05-18 LAB — VITAMIN B12: Vitamin B-12: 433 pg/mL (ref 180–914)

## 2020-05-18 LAB — C-REACTIVE PROTEIN: CRP: 1.1 mg/dL — ABNORMAL HIGH (ref <1.0)

## 2020-05-18 LAB — D-DIMER, QUANTITATIVE: D-Dimer, Quant: 13.19 ug/mL-FEU — ABNORMAL HIGH (ref 0.00–0.50)

## 2020-05-18 LAB — HEPARIN LEVEL (UNFRACTIONATED): Heparin Unfractionated: 0.44 IU/mL (ref 0.30–0.70)

## 2020-05-18 LAB — TROPONIN I (HIGH SENSITIVITY): Troponin I (High Sensitivity): 495 ng/L (ref <18)

## 2020-05-18 LAB — FERRITIN
Ferritin: 62 ng/mL (ref 24–336)
Ferritin: 61 ng/mL (ref 24–336)

## 2020-05-18 LAB — MAGNESIUM: Magnesium: 2.2 mg/dL (ref 1.7–2.4)

## 2020-05-18 NOTE — Progress Notes (Addendum)
PROGRESS NOTE    Derrick Huffman  ZOX:[id number] DOB: 27-Apr-1960 DOA: 05/16/2020 PCP: Gwenevere Ghazi, MD     Brief Narrative:  Derrick Huffman is a 60 y.o. BM PMHx COPD    Presented with   Syncope and shortness of breath Patient was brought in by EMS from [facility].  Where he was found to have a syncopal episode he reported some generalized fatigue shortness of breath cough diarrhea for past 1 week. On EMS arrival his blood pressures was in the 60s.  Denied any head injury. Apparently he was gradually assisted to the ground by his girlfriend Reports he does not smoke he quit when he was diagnosis of COPD  states that he drinks only on occasion   Subjective: 1/25 afebrile overnight A/O x4, positive S OB.     Assessment & Plan: Covid vaccination; vaccinated 2/3   Active Problems:   Pulmonary embolism ([place])   COVID-19 virus infection   Syncope and collapse   Sepsis due to pneumonia ([place])   Pneumonia due to COVID-19 virus   Acute respiratory failure with hypoxia ([facility])   Anemia, unspecified   Postural dizziness with presyncope   Acute diastolic CHF (congestive heart failure) ([place])   Sepsis due to pneumonia -On admission patient met criteria for sepsis HR>90, RR>20, site of infection lungs  Acute diastolic CHF -Strict in and out -2.4 L -Daily weight Filed Weights   05/16/20 1755  Weight: 64.4 kg   Presyncope -Most likely secondary to acute PE, and Covid infection.  Acute PE -Most likely secondary to hypercoagulable state from Covid infection -CTA PE protocol; shows PE with right heart strain see results below -1/23 stat echocardiogram pending -1/23 Dr. Chase Caller PCCM aware of case standing by for results of echocardiogram, may require thrombolytics. -Trend troponin sensitive -1/23 stat bilateral lower extremity Doppler Pending -Heparin drip -ADDENDUM; after reviewing echocardiogram we will hold on thrombolytics if patient becomes unstable we will  revisit use of thrombolytics.  Covid infection -DG chest/CTA PE protocol without evidence of pulmonary infiltrates -Patient hypoxic most likely secondary to large bilateral PE -Remdesivir per pharmacy protocol -Solu-Medrol 60 mg BID -Vitamins per Covid protocol -Combivent QID -Incentive spirometry -Flutter valve -Prone patient 16 hours/day.  If cannot tolerate prone 2 to 3 hours per shift  Anemia normocytic -Anemia panel; most consistent with normocytic anemia -Hemoccult pending  Sickle Cell Trait    DVT prophylaxis: Heparin drip Code Status: Full Family Communication: 1/25 spoke with Kenney Houseman (sister) counseled on plan of care answered all questions Status is: Inpatient    Dispo: The patient is from: Home              Anticipated d/c is to: Home              Anticipated d/c date is: 1/30              Patient currently unstable      Consultants:  PCCM   Procedures/Significant Events:  1/23 CTA PE protocol:-Multiple bilateral acute pulmonary emboli.  -CT evidence of right heart strain (RV/LV Ratio 1.2) consistent with at least submassive (intermediate risk) PE.  -Small esophageal hiatal hernia. -Probable gallstones. 1/24 echocardiogram,Left Ventricle: LVEF= 55 to 60%.  -Grade I diastolic dysfunction (impaired relaxation).       I have personally reviewed and interpreted all radiology studies and my findings are as above.  VENTILATOR SETTINGS: Nasal cannula 1/25 Flow 2 L/min SPO2 85%   Cultures   Antimicrobials: Anti-infectives (From admission, onward)  Start     Ordered Stop   05/17/20 1000  remdesivir 100 mg in sodium chloride 0.9 % 100 mL IVPB       "Followed by" Linked Group Details   05/16/20 2340 05/21/20 0959   05/16/20 2345  remdesivir 200 mg in sodium chloride 0.9% 250 mL IVPB       "Followed by" Linked Group Details   05/16/20 2340 05/17/20 0106       Devices    LINES / TUBES:      Continuous Infusions: . heparin 1,300  Units/hr (05/18/20 1037)  . remdesivir 100 mg in NS 100 mL 100 mg (05/18/20 1203)     Objective: Vitals:   05/17/20 1822 05/17/20 2307 05/18/20 0309 05/18/20 0630  BP: 116/78 122/86 125/89 (!) 126/95  Pulse: (!) 101 80 80 81  Resp: (!) '21 20 20 20  ' Temp: 98.3 F (36.8 C) 98.3 F (36.8 C) 98 F (36.7 C) 98.4 F (36.9 C)  TempSrc: Oral Oral Oral Oral  SpO2: 98% 97% 100% (!) 85%  Weight:      Height:        Intake/Output Summary (Last 24 hours) at 05/18/2020 1212 Last data filed at 05/18/2020 1038 Gross per 24 hour  Intake 135.67 ml  Output 2550 ml  Net -2414.33 ml   Filed Weights   05/16/20 1755  Weight: 64.4 kg    Examination:  General: A/O x4, positive acute respiratory distress, cachectic Eyes: negative scleral hemorrhage, negative anisocoria, negative icterus ENT: Negative Runny nose, negative gingival bleeding, Neck:  Negative scars, masses, torticollis, lymphadenopathy, JVD Lungs: Clear to auscultation bilaterally without wheezes or crackles Cardiovascular: Regular rate and rhythm without murmur gallop or rub normal S1 and S2 Abdomen: negative abdominal pain, nondistended, positive soft, bowel sounds, no rebound, no ascites, no appreciable mass Extremities: No significant cyanosis, clubbing, or edema bilateral lower extremities Skin: Negative rashes, lesions, ulcers Psychiatric:  Negative depression, negative anxiety, negative fatigue, negative mania  Central nervous system:  Cranial nerves II through XII intact, tongue/uvula midline, all extremities muscle strength 5/5, sensation intact throughout, negative dysarthria, negative expressive aphasia, negative receptive aphasia.  .     Data Reviewed: Care during the described time interval was provided by me .  I have reviewed this patient's available data, including medical history, events of note, physical examination, and all test results as part of my evaluation.  CBC: Recent Labs  Lab 05/16/20 1842  05/17/20 0818 05/18/20 0452  WBC 21.8* 11.0* 14.1*  NEUTROABS 18.5* 8.2* 11.8*  HGB 9.8* 9.2* 10.4*  HCT 27.9* 26.2* 29.5*  MCV 84.3 83.2 84.0  PLT 147* 148* 496   Basic Metabolic Panel: Recent Labs  Lab 05/16/20 1842 05/17/20 0540 05/17/20 0818 05/18/20 0452  NA 141 138 141 139  K 4.4 4.4 4.2 4.9  CL 114* 110 112* 108  CO2 21* 21* 22 21*  GLUCOSE 133* 148* 140* 137*  BUN '15 15 13 16  ' CREATININE 0.89 0.97 0.90 0.73  CALCIUM 7.9* 8.6* 8.9 9.4  MG  --  1.8 1.9 2.2  PHOS  --   --  3.4 3.7   GFR: Estimated Creatinine Clearance: 90.6 mL/min (by C-G formula based on SCr of 0.73 mg/dL). Liver Function Tests: Recent Labs  Lab 05/16/20 1842 05/17/20 0540 05/17/20 0818 05/18/20 0452  AST 51* 33 27 35  ALT '22 23 22 24  ' ALKPHOS 57 67 62 62  BILITOT 3.5* 1.2 1.3* 1.1  PROT 6.7 6.8 6.9 7.6  ALBUMIN  3.0* 3.2* 3.3* 3.5   No results for input(s): LIPASE, AMYLASE in the last 168 hours. No results for input(s): AMMONIA in the last 168 hours. Coagulation Profile: No results for input(s): INR, PROTIME in the last 168 hours. Cardiac Enzymes: Recent Labs  Lab 05/17/20 0200  CKTOTAL 82   BNP (last 3 results) No results for input(s): PROBNP in the last 8760 hours. HbA1C: No results for input(s): HGBA1C in the last 72 hours. CBG: No results for input(s): GLUCAP in the last 168 hours. Lipid Profile: No results for input(s): CHOL, HDL, LDLCALC, TRIG, CHOLHDL, LDLDIRECT in the last 72 hours. Thyroid Function Tests: No results for input(s): TSH, T4TOTAL, FREET4, T3FREE, THYROIDAB in the last 72 hours. Anemia Panel: Recent Labs    05/17/20 0131 05/17/20 1131 05/18/20 0452  VITAMINB12 385  --  433  FOLATE 11.9  --  10.0  FERRITIN  --  67 61  62  TIBC 296  --  352  IRON 41*  --  37*  RETICCTPCT 7.6*  --  6.7*   Sepsis Labs: Recent Labs  Lab 05/17/20 0540 05/17/20 1132 05/17/20 1350  PROCALCITON 1.62  --   --   LATICACIDVEN  --  1.2 1.5    Recent Results (from  the past 240 hour(s))  SARS Coronavirus 2 by RT PCR (hospital order, performed in [facility] hospital lab) Nasopharyngeal Nasopharyngeal Swab     Status: Abnormal   Collection Time: 05/16/20  6:23 PM   Specimen: Nasopharyngeal Swab  Result Value Ref Range Status   SARS Coronavirus 2 POSITIVE (A) NEGATIVE Final    Comment: RESULT CALLED TO, READ BACK BY AND VERIFIED WITH: [place] RN 05/16/20 '@2109'  BY P.HENDERSON (NOTE) SARS-CoV-2 target nucleic acids are DETECTED  SARS-CoV-2 RNA is generally detectable in upper respiratory specimens  during the acute phase of infection.  Positive results are indicative  of the presence of the identified virus, but do not rule out bacterial infection or co-infection with other pathogens not detected by the test.  Clinical correlation with patient history and  other diagnostic information is necessary to determine patient infection status.  The expected result is negative.  Fact Sheet for Patients:   StrictlyIdeas.no   Fact Sheet for Healthcare Providers:   BankingDealers.co.za    This test is not yet approved or cleared by the [place] FDA and  has been authorized for detection and/or diagnosis of SARS-CoV-2 by FDA under an Emergency Use Authorization (EUA).  This EUA will remain in effect (m eaning this test can be used) for the duration of  the COVID-19 declaration under Section 564(b)(1) of the Act, 21 U.S.C. section 360-bbb-3(b)(1), unless the authorization is terminated or revoked sooner.  Performed at [facility], [place] [address], [place], [place] [address]   Culture, blood (Routine X 2) w Reflex to ID Panel     Status: None (Preliminary result)   Collection Time: 05/16/20 11:37 PM   Specimen: BLOOD  Result Value Ref Range Status   Specimen Description   Final    BLOOD RIGHT ANTECUBITAL Performed at [facility], [place] [address] [place], [place]  [address]    Special Requests   Final    BOTTLES DRAWN AEROBIC AND ANAEROBIC Blood Culture adequate volume Performed at [place] [address] [place], [place] [address]    Culture PENDING  Incomplete   Report Status PENDING  Incomplete  Culture, blood (Routine X 2) w Reflex to ID Panel  Status: None (Preliminary result)   Collection Time: 05/16/20 11:42 PM   Specimen: BLOOD  Result Value Ref Range Status   Specimen Description   Final    BLOOD LEFT ANTECUBITAL Performed at [facility], [place] [address], [place], [place] [address]    Special Requests   Final    BOTTLES DRAWN AEROBIC AND ANAEROBIC Blood Culture adequate volume Performed at [place] [address], [place], [place] [address]    Culture PENDING  Incomplete   Report Status PENDING  Incomplete         Radiology Studies: CT Angio Chest PE W and/or Wo Contrast  Result Date: 05/16/2020 CLINICAL DATA:  Pulmonary embolus suspected with high probability. Syncopal episode. Generalized weakness, shortness of breath, cough, and diarrhea. EXAM: CT ANGIOGRAPHY CHEST WITH CONTRAST TECHNIQUE: Multidetector CT imaging of the chest was performed using the standard protocol during bolus administration of intravenous contrast. Multiplanar CT image reconstructions and MIPs were obtained to evaluate the vascular anatomy. CONTRAST:  145m OMNIPAQUE IOHEXOL 350 MG/ML SOLN COMPARISON:  Chest radiograph 05/16/2020 FINDINGS: Cardiovascular: Motion artifact limits examination. There is however moderately good visualization of the central and segmental pulmonary arteries. Multiple filling defects are demonstrated in the distal main pulmonary arteries and in multiple upper lobe and lower lobe segmental branches consistent with multiple acute pulmonary emboli. The RV to LV ratio is 1.2, suggesting evidence for right heart strain. No pericardial effusions. Normal caliber thoracic aorta. No aortic  dissection. Great vessel origins are patent. Mediastinum/Nodes: Small esophageal hiatal hernia. Esophagus is decompressed. No significant lymphadenopathy. Lungs/Pleura: Mild atelectasis in the right lung base. Lungs are otherwise clear. No pleural effusions. No pneumothorax. Airways are patent. Upper Abdomen: No acute abnormalities demonstrated in the visualized upper abdomen. Probable gallstones incompletely evaluated. Musculoskeletal: Degenerative changes in the spine and shoulders. Review of the MIP images confirms the above findings. IMPRESSION: 1. Multiple bilateral acute pulmonary emboli. CT evidence of right heart strain (RV/LV Ratio 1.2) consistent with at least submassive (intermediate risk) PE. The presence of right heart strain has been associated with an increased risk of morbidity and mortality. 2. Small esophageal hiatal hernia. 3. Probable gallstones. Critical Value/emergent results were called by telephone at the time of interpretation on 05/16/2020 at 10:51 pm to provider JOSEPH ZAMMIT , who verbally acknowledged these results. Electronically Signed   By: WLucienne CapersM.D.   On: 05/16/2020 22:54   DG Chest Port 1 View  Result Date: 05/16/2020 CLINICAL DATA:  Syncopal episode EXAM: PORTABLE CHEST 1 VIEW COMPARISON:  07/29/2019 FINDINGS: There are advanced degenerative changes of the right glenohumeral joint. The heart size is stable from prior study. There is no pneumothorax. No large pleural effusion. No focal infiltrate. IMPRESSION: No active disease. Electronically Signed   By: CConstance HolsterM.D.   On: 05/16/2020 19:02   ECHOCARDIOGRAM COMPLETE  Result Date: 05/17/2020    ECHOCARDIOGRAM REPORT   Patient Name:   JYOUSOF ALDERMANDate of Exam: 05/17/2020 Medical Rec #:  [id number]    Height:       68.0 in Accession #:    [id number]   Weight:       142.0 lb Date of Birth:  7Jan 31, 1962    BSA:          1.767 m Patient Age:    89years      BP:           120/89 mmHg Patient Gender: M  HR:           93 bpm. Exam Location:  Inpatient Procedure: 2D Echo, Cardiac Doppler and Color Doppler STAT ECHO Indications:    I50.23 Acute on chronic systolic (congestive) heart failure  History:        Patient has no prior history of Echocardiogram examinations.                 COPD. COVID-19 Positive.  Sonographer:    Jonelle Sidle Dance Referring Phys: [id number] [place]  1. Left ventricular ejection fraction, by estimation, is 55 to 60%. The left ventricle has normal function. The left ventricle has no regional wall motion abnormalities. Left ventricular diastolic parameters are consistent with Grade I diastolic dysfunction (impaired relaxation).  2. Right ventricular systolic function is normal. The right ventricular size is mildly enlarged. There is normal pulmonary artery systolic pressure. The estimated right ventricular systolic pressure is 85.2 mmHg.  3. The mitral valve is grossly normal. Trivial mitral valve regurgitation.  4. The aortic valve is tricuspid. There is mild calcification of the aortic valve. There is mild thickening of the aortic valve. Aortic valve regurgitation is trivial.  5. The inferior vena cava is normal in size with greater than 50% respiratory variability, suggesting right atrial pressure of 3 mmHg. Comparison(s): No prior Echocardiogram. FINDINGS  Left Ventricle: Left ventricular ejection fraction, by estimation, is 55 to 60%. The left ventricle has normal function. The left ventricle has no regional wall motion abnormalities. The left ventricular internal cavity size was normal in size. There is  no left ventricular hypertrophy. Left ventricular diastolic parameters are consistent with Grade I diastolic dysfunction (impaired relaxation). Right Ventricle: The right ventricular size is mildly enlarged. No increase in right ventricular wall thickness. Right ventricular systolic function is normal. There is normal pulmonary artery systolic pressure. The tricuspid  regurgitant velocity is 2.17  m/s, and with an assumed right atrial pressure of 3 mmHg, the estimated right ventricular systolic pressure is 77.8 mmHg. Left Atrium: Left atrial size was normal in size. Right Atrium: Right atrial size was normal in size. Pericardium: There is no evidence of pericardial effusion. Mitral Valve: The mitral valve is grossly normal. There is mild thickening of the mitral valve leaflet(s). Trivial mitral valve regurgitation. Tricuspid Valve: The tricuspid valve is normal in structure. Tricuspid valve regurgitation is trivial. Aortic Valve: The aortic valve is tricuspid. There is mild calcification of the aortic valve. There is mild thickening of the aortic valve. Aortic valve regurgitation is trivial. Pulmonic Valve: The pulmonic valve was normal in structure. Pulmonic valve regurgitation is trivial. Aorta: The aortic root and ascending aorta are structurally normal, with no evidence of dilitation. Venous: The inferior vena cava is normal in size with greater than 50% respiratory variability, suggesting right atrial pressure of 3 mmHg. IAS/Shunts: No atrial level shunt detected by color flow Doppler.  LEFT VENTRICLE PLAX 2D LVIDd:         3.80 cm  Diastology LVIDs:         2.70 cm  LV e' medial:    107.22 cm/s LV PW:         1.05 cm  LV E/e' medial:  0.6 LV IVS:        0.80 cm  LV e' lateral:   9.36 cm/s LVOT diam:     2.20 cm  LV E/e' lateral: 7.3 LV SV:         71 LV SV Index:   40 LVOT Area:  3.80 cm  RIGHT VENTRICLE            IVC RV Basal diam:  3.10 cm    IVC diam: 2.00 cm RV Mid diam:    2.70 cm RV S prime:     9.90 cm/s TAPSE (M-mode): 2.0 cm LEFT ATRIUM           Index       RIGHT ATRIUM           Index LA diam:      3.00 cm 1.70 cm/m  RA Area:     13.05 cm LA Vol (A2C): 53.6 ml 30.34 ml/m RA Volume:   33.25 ml  18.82 ml/m LA Vol (A4C): 22.2 ml 12.56 ml/m  AORTIC VALVE LVOT Vmax:   108.00 cm/s LVOT Vmean:  69.200 cm/s LVOT VTI:    0.187 m  AORTA Ao Root diam: 3.40 cm Ao  Asc diam:  3.50 cm MITRAL VALVE               TRICUSPID VALVE MV Area (PHT): 2.91 cm    TR Peak grad:   18.8 mmHg MV Decel Time: 261 msec    TR Vmax:        217.00 cm/s MV E velocity: 68.30 cm/s MV A velocity: 96.10 cm/s  SHUNTS MV E/A ratio:  0.71        Systemic VTI:  0.19 m                            Systemic Diam: 2.20 cm Gwyndolyn Kaufman MD Electronically signed by Gwyndolyn Kaufman MD Signature Date/Time: 05/17/2020/2:02:00 PM    Final    VAS US CAROTID  Result Date: 05/17/2020 Carotid Arterial Duplex Study Indications:       Syncope. Other Factors:     COVID 19 positive. Comparison Study:  No prior studies. Performing Technologist: Oliver Hum RVT  Examination Guidelines: A complete evaluation includes B-mode imaging, spectral Doppler, color Doppler, and power Doppler as needed of all accessible portions of each vessel. Bilateral testing is considered an integral part of a complete examination. Limited examinations for reoccurring indications may be performed as noted.  Right Carotid Findings: +----------+--------+--------+--------+-----------------------+--------+           PSV cm/sEDV cm/sStenosisPlaque Description     Comments +----------+--------+--------+--------+-----------------------+--------+ CCA Prox  55      9               smooth and heterogenoustortuous +----------+--------+--------+--------+-----------------------+--------+ CCA Distal77      25              smooth and heterogenous         +----------+--------+--------+--------+-----------------------+--------+ ICA Prox  53      18              smooth and heterogenous         +----------+--------+--------+--------+-----------------------+--------+ ICA Distal75      34                                     tortuous +----------+--------+--------+--------+-----------------------+--------+ ECA       59      13                                               +----------+--------+--------+--------+-----------------------+--------+ +----------+--------+-------+--------+-------------------+  PSV cm/sEDV cmsDescribeArm Pressure (mmHG) +----------+--------+-------+--------+-------------------+ Subclavian40                                         +----------+--------+-------+--------+-------------------+ +---------+--------+--+--------+--+---------+ VertebralPSV cm/s46EDV cm/s20Antegrade +---------+--------+--+--------+--+---------+  Left Carotid Findings: +----------+--------+--------+--------+-----------------------+--------+           PSV cm/sEDV cm/sStenosisPlaque Description     Comments +----------+--------+--------+--------+-----------------------+--------+ CCA Prox  67      21              smooth and heterogenous         +----------+--------+--------+--------+-----------------------+--------+ CCA Distal81      28              smooth and heterogenous         +----------+--------+--------+--------+-----------------------+--------+ ICA Prox  58      23                                              +----------+--------+--------+--------+-----------------------+--------+ ICA Distal54      27                                     tortuous +----------+--------+--------+--------+-----------------------+--------+ ECA       74      17                                              +----------+--------+--------+--------+-----------------------+--------+ +----------+--------+--------+--------+-------------------+           PSV cm/sEDV cm/sDescribeArm Pressure (mmHG) +----------+--------+--------+--------+-------------------+ [id number]                                          +----------+--------+--------+--------+-------------------+ +---------+--------+--+--------+--+---------+ VertebralPSV cm/s37EDV cm/s19Antegrade +---------+--------+--+--------+--+---------+   Summary: Right Carotid:  Velocities in the right ICA are consistent with a 1-39% stenosis. Left Carotid: Velocities in the left ICA are consistent with a 1-39% stenosis. Vertebrals: Bilateral vertebral arteries demonstrate antegrade flow. *See table(s) above for measurements and observations.  Electronically signed by Ruta Hinds MD on 05/17/2020 at 6:18:52 PM.    Final    VAS [place] LOWER EXTREMITY VENOUS (DVT)  Result Date: 05/17/2020  Lower Venous DVT Study Indications: Pulmonary embolism.  Risk Factors: COVID 19 positive. Comparison Study: No prior studies. Performing Technologist: Oliver Hum RVT  Examination Guidelines: A complete evaluation includes B-mode imaging, spectral Doppler, color Doppler, and power Doppler as needed of all accessible portions of each vessel. Bilateral testing is considered an integral part of a complete examination. Limited examinations for reoccurring indications may be performed as noted. The reflux portion of the exam is performed with the patient in reverse Trendelenburg.  +---------+---------------+---------+-----------+----------+--------------+ RIGHT    CompressibilityPhasicitySpontaneityPropertiesThrombus Aging +---------+---------------+---------+-----------+----------+--------------+ CFV      Full           Yes      Yes                                 +---------+---------------+---------+-----------+----------+--------------+ SFJ  Full                                                        +---------+---------------+---------+-----------+----------+--------------+ FV Prox  Full                                                        +---------+---------------+---------+-----------+----------+--------------+ FV Mid   Full                                                        +---------+---------------+---------+-----------+----------+--------------+ FV DistalFull                                                         +---------+---------------+---------+-----------+----------+--------------+ PFV      Full                                                        +---------+---------------+---------+-----------+----------+--------------+ POP      None           No       No                   Acute          +---------+---------------+---------+-----------+----------+--------------+ PTV      Partial                                      Acute          +---------+---------------+---------+-----------+----------+--------------+ PERO     None                                         Acute          +---------+---------------+---------+-----------+----------+--------------+ Gastroc  None                                         Acute          +---------+---------------+---------+-----------+----------+--------------+   +---------+---------------+---------+-----------+----------+--------------+ LEFT     CompressibilityPhasicitySpontaneityPropertiesThrombus Aging +---------+---------------+---------+-----------+----------+--------------+ CFV      Full           Yes      Yes                                 +---------+---------------+---------+-----------+----------+--------------+ SFJ      Full                                                        +---------+---------------+---------+-----------+----------+--------------+  FV Prox  Full                                                        +---------+---------------+---------+-----------+----------+--------------+ FV Mid   Full                                                        +---------+---------------+---------+-----------+----------+--------------+ FV DistalFull                                                        +---------+---------------+---------+-----------+----------+--------------+ PFV      Full                                                         +---------+---------------+---------+-----------+----------+--------------+ POP      Full           Yes      Yes                                 +---------+---------------+---------+-----------+----------+--------------+ PTV      Full                                                        +---------+---------------+---------+-----------+----------+--------------+ PERO     Full                                                        +---------+---------------+---------+-----------+----------+--------------+     Summary: RIGHT: - Findings consistent with acute deep vein thrombosis involving the right popliteal vein, right posterior tibial veins, right peroneal veins, and right gastrocnemius veins. - No cystic structure found in the popliteal fossa.  LEFT: - There is no evidence of deep vein thrombosis in the lower extremity.  - No cystic structure found in the popliteal fossa.  *See table(s) above for measurements and observations. Electronically signed by Ruta Hinds MD on 05/17/2020 at 6:17:57 PM.    Final         Scheduled Meds: . vitamin C  500 mg Oral Daily  . Ipratropium-Albuterol  1 puff Inhalation QID  . methylPREDNISolone (SOLU-MEDROL) injection  60 mg Intravenous Q12H  . sodium chloride flush  3 mL Intravenous Q12H  . zinc sulfate  220 mg Oral Daily   Continuous Infusions: . heparin 1,300 Units/hr (05/18/20 1037)  . remdesivir 100 mg in NS 100 mL 100 mg (05/18/20 1203)     LOS: 2 days  Time spent:40 min    Morgin Halls, Geraldo Docker, MD [facility] Pager [phone]  If 7PM-7AM, please contact night-coverage [email or web address] Password [email or web address] 05/18/2020, 12:12 PM

## 2020-05-18 NOTE — Progress Notes (Signed)
ANTICOAGULATION CONSULT NOTE  Pharmacy Consult for Heparin Indication: pulmonary embolus  No Known Allergies  Patient Measurements: Height: 5\' 8"  (172.7 cm) Weight: 64.4 kg (142 lb) IBW/kg (Calculated) : 68.4 Heparin Dosing Weight: total body weight  Vital Signs: Temp: 98.4 F (36.9 C) (01/25 0630) Temp Source: Oral (01/25 0630) BP: 126/95 (01/25 0630) Pulse Rate: 81 (01/25 0630)  Labs: Recent Labs    05/16/20 1842 05/16/20 1842 05/16/20 2033 05/17/20 0200 05/17/20 0540 05/17/20 0818 05/17/20 1132 05/17/20 1330 05/17/20 1339 05/17/20 2201 05/18/20 0452  HGB 9.8*  --   --   --   --  9.2*  --   --   --   --  10.4*  HCT 27.9*  --   --   --   --  26.2*  --   --   --   --  29.5*  PLT 147*  --   --   --   --  148*  --   --   --   --  163  HEPARINUNFRC  --    < >  --   --  0.19*  --   --   --  0.73* 0.35 0.44  CREATININE 0.89  --   --   --  0.97 0.90  --   --   --   --  0.73  CKTOTAL  --   --   --  82  --   --   --   --   --   --   --   TROPONINIHS  --   --    < > 535* 663* 726* 700* 647*  --   --  495*   < > = values in this interval not displayed.    Estimated Creatinine Clearance: 90.6 mL/min (by C-G formula based on SCr of 0.73 mg/dL).  Medications:  No anticoagulation PTA  Assessment: 65 yoM presents to ED s/p syncopal episode. PMH significant for COPD. CTAngio shows multiple bilateral PE with evidence of right heart strain. Pharmacy consulted to dose IV heparin. Echo done 1/24 shows much less evidence of RH dilation.   Baseline INR, aPTT: not done  Prior anticoagulation: none  Significant events:  Today, 05/18/2020:  CBC stable  Most recent heparin level at goal on heparin at 1300 units/hr  No bleeding or infusion issues reported  Goal of Therapy: Heparin level 0.3-0.7 units/ml Monitor platelets by anticoagulation protocol: Yes  Plan:  Continue heparin infusion at 1300 units/hr  Daily CBC, daily heparin level    Monitor for signs of bleeding  or thrombosis  05/20/2020  05/18/2020, 9:25 AM

## 2020-05-19 DIAGNOSIS — I5031 Acute diastolic (congestive) heart failure: Secondary | ICD-10-CM | POA: Diagnosis not present

## 2020-05-19 DIAGNOSIS — R55 Syncope and collapse: Secondary | ICD-10-CM | POA: Diagnosis not present

## 2020-05-19 DIAGNOSIS — D538 Other specified nutritional anemias: Secondary | ICD-10-CM | POA: Diagnosis not present

## 2020-05-19 DIAGNOSIS — J9601 Acute respiratory failure with hypoxia: Secondary | ICD-10-CM | POA: Diagnosis not present

## 2020-05-19 LAB — CBC WITH DIFFERENTIAL/PLATELET
Abs Immature Granulocytes: 0.08 K/uL — ABNORMAL HIGH (ref 0.00–0.07)
Basophils Absolute: 0 K/uL (ref 0.0–0.1)
Basophils Relative: 0 %
Eosinophils Absolute: 0 K/uL (ref 0.0–0.5)
Eosinophils Relative: 0 %
HCT: 31.1 % — ABNORMAL LOW (ref 39.0–52.0)
Hemoglobin: 11.1 g/dL — ABNORMAL LOW (ref 13.0–17.0)
Immature Granulocytes: 1 %
Lymphocytes Relative: 6 %
Lymphs Abs: 1 K/uL (ref 0.7–4.0)
MCH: 29.8 pg (ref 26.0–34.0)
MCHC: 35.7 g/dL (ref 30.0–36.0)
MCV: 83.4 fL (ref 80.0–100.0)
Monocytes Absolute: 0.8 K/uL (ref 0.1–1.0)
Monocytes Relative: 6 %
Neutro Abs: 13.2 K/uL — ABNORMAL HIGH (ref 1.7–7.7)
Neutrophils Relative %: 87 %
Platelets: 166 K/uL (ref 150–400)
RBC: 3.73 MIL/uL — ABNORMAL LOW (ref 4.22–5.81)
RDW: 16.4 % — ABNORMAL HIGH (ref 11.5–15.5)
WBC: 15.1 K/uL — ABNORMAL HIGH (ref 4.0–10.5)
nRBC: 1.3 % — ABNORMAL HIGH (ref 0.0–0.2)

## 2020-05-19 LAB — COMPREHENSIVE METABOLIC PANEL WITH GFR
ALT: 21 U/L (ref 0–44)
AST: 16 U/L (ref 15–41)
Albumin: 3.5 g/dL (ref 3.5–5.0)
Alkaline Phosphatase: 57 U/L (ref 38–126)
Anion gap: 9 (ref 5–15)
BUN: 21 mg/dL — ABNORMAL HIGH (ref 6–20)
CO2: 23 mmol/L (ref 22–32)
Calcium: 9.4 mg/dL (ref 8.9–10.3)
Chloride: 104 mmol/L (ref 98–111)
Creatinine, Ser: 0.87 mg/dL (ref 0.61–1.24)
GFR, Estimated: >60 mL/min
Glucose, Bld: 189 mg/dL — ABNORMAL HIGH (ref 70–99)
Potassium: 4.2 mmol/L (ref 3.5–5.1)
Sodium: 136 mmol/L (ref 135–145)
Total Bilirubin: 1.1 mg/dL (ref 0.3–1.2)
Total Protein: 7.1 g/dL (ref 6.5–8.1)

## 2020-05-19 LAB — FERRITIN: Ferritin: 45 ng/mL (ref 24–336)

## 2020-05-19 LAB — C-REACTIVE PROTEIN: CRP: 0.7 mg/dL (ref <1.0)

## 2020-05-19 LAB — D-DIMER, QUANTITATIVE: D-Dimer, Quant: 7.9 ug/mL-FEU — ABNORMAL HIGH (ref 0.00–0.50)

## 2020-05-19 LAB — HEPARIN LEVEL (UNFRACTIONATED): Heparin Unfractionated: 0.38 IU/mL (ref 0.30–0.70)

## 2020-05-19 LAB — MAGNESIUM: Magnesium: 2 mg/dL (ref 1.7–2.4)

## 2020-05-19 LAB — PHOSPHORUS: Phosphorus: 3.4 mg/dL (ref 2.5–4.6)

## 2020-05-19 MED ORDER — APIXABAN 5 MG PO TABS: 5.0000 mg | ORAL_TABLET | Freq: Two times a day (BID) | ORAL | Status: DC

## 2020-05-19 MED ORDER — APIXABAN 5 MG PO TABS
10.0000 mg | ORAL_TABLET | Freq: Two times a day (BID) | ORAL | Status: DC
  Administered 2020-05-19 – 2020-05-20 (×2): 10 mg via ORAL
  Filled 2020-05-19 (×2): qty 2

## 2020-05-19 MED ORDER — PREDNISONE 20 MG PO TABS
20.0000 mg | ORAL_TABLET | Freq: Every day | ORAL | Status: DC
  Administered 2020-05-20: 20 mg via ORAL
  Filled 2020-05-19: qty 1

## 2020-05-19 NOTE — Progress Notes (Signed)
PROGRESS NOTE    Erol Flanagin  YHC:[id number] DOB: 03-28-1961 DOA: 05/16/2020 PCP: Bethanie Dicker, MD    Brief Narrative:  [name] was admitted to the hospital working diagnosis of acute hypoxic respiratory failure due to pulmonary embolism in the setting of COVID-19 infection.  60 year old male past medical history for COPD who presented with dyspnea and syncope.  He reported 1 week of diarrhea and shortness of breath, he sustained a syncope episode and was brought to the hospital.  When EMS arrived his blood pressure systolic was 60.  In the ED his heart rate 118, blood pressure 126/95, respiratory rate 26, temperature is 97.4, his lungs were showing minimal wheezing, heart S1-S2, present rhythmic, soft abdomen, no lower extremity edema.  SARS COVID-19 positive. Chest CT with multiple bilateral acute pulmonary emboli.  Evidence of right heart strain.  Chest radiograph with no frank infiltrates.  Patient was placed on anticoagulation with heparin with good toleration.  Medically treated with remdesivir, methylprednisolone and bronchodilators.  Assessment & Plan:   Active Problems:   Pulmonary embolism (HCC)   COVID-19 virus infection   Syncope and collapse   Sepsis due to pneumonia (HCC)   Pneumonia due to COVID-19 virus   Acute respiratory failure with hypoxia (HCC)   Anemia, unspecified   Postural dizziness with presyncope   Acute diastolic CHF (congestive heart failure) (HCC)   1. Acute SARS COVID 19 viral infection complicated with acute PE.  Echocardiogram with preserved RV and LV systolic function, blood pressure systolic 126 mmHg.   Continue anticoagulation with apixaban.   COVID-19 Labs  Recent Labs    05/17/20 0540 05/17/20 0818 05/17/20 1131 05/18/20 0452 05/19/20 0444  DDIMER 14.84* 12.51*  --  13.19* 7.90*  FERRITIN  --   --  67 61  62 45  LDH 281*  --   --   --   --   CRP  --   --  1.6* 1.1* 0.7    Lab Results  Component Value Date    SARSCOV2NAA POSITIVE (A) 05/16/2020   Low inflammatory markers, will taper steroids and continue with remdesivir for now.  Check ambulatory oxygenation on room air. Continue with bronchodilator therapy, antitussive agents and airway clearing techniques.  Consult PT and OT.   2. Diastolic CHF. Stable with no signs of exacerbation.  3. Sickle cell train and chronic anemia. Cell count has been stable.      Status is: Inpatient  Remains inpatient appropriate because:IV treatments appropriate due to intensity of illness or inability to take PO   Dispo: The patient is from: Home              Anticipated d/c is to: Home              Anticipated d/c date is: 1 day              Patient currently is not medically stable to d/c.   Difficult to place patient No   DVT prophylaxis: Heparin/ apixaban   Code Status:   full  Family Communication:  No family at the bedside      Subjective: Patient is feeling better, dyspnea has been improving, no nausea or vomiting no chest pain.   Objective: Vitals:   05/18/20 1240 05/18/20 2039 05/19/20 0615 05/19/20 1216  BP: 121/83 129/88 (!) 128/93 126/90  Pulse: 99 90 84 80  Resp: (!) 22 20 18 18   Temp: 98.5 F (36.9 C) 98 F (36.7 C) 98.2 F (36.8 C)  98.2 F (36.8 C)  TempSrc: Oral Oral Oral Oral  SpO2: (!) 83% 99% 98% 100%  Weight:      Height:        Intake/Output Summary (Last 24 hours) at 05/19/2020 1524 Last data filed at 05/19/2020 0900 Gross per 24 hour  Intake 818.26 ml  Output 2450 ml  Net -1631.74 ml   Filed Weights   05/16/20 1755  Weight: 64.4 kg    Examination:   General: Not in pain or dyspnea, deconditioned  Neurology: Awake and alert, non focal  E ENT: mild pallor, no icterus, oral mucosa moist Cardiovascular: No JVD. S1-S2 present, rhythmic, no gallops, rubs, or murmurs. No lower extremity edema. Pulmonary: positive breath sounds bilaterally, with no rhonchi or rales. Gastrointestinal. Abdomen soft and non  tender Skin. No rashes Musculoskeletal: no joint deformities     Data Reviewed: I have personally reviewed following labs and imaging studies  CBC: Recent Labs  Lab 05/16/20 1842 05/17/20 0818 05/18/20 0452 05/19/20 0444  WBC 21.8* 11.0* 14.1* 15.1*  NEUTROABS 18.5* 8.2* 11.8* 13.2*  HGB 9.8* 9.2* 10.4* 11.1*  HCT 27.9* 26.2* 29.5* 31.1*  MCV 84.3 83.2 84.0 83.4  PLT 147* 148* 163 166   Basic Metabolic Panel: Recent Labs  Lab 05/16/20 1842 05/17/20 0540 05/17/20 0818 05/18/20 0452 05/19/20 0444  NA 141 138 141 139 136  K 4.4 4.4 4.2 4.9 4.2  CL 114* 110 112* 108 104  CO2 21* 21* 22 21* 23  GLUCOSE 133* 148* 140* 137* 189*  BUN 15 15 13 16  21*  CREATININE 0.89 0.97 0.90 0.73 0.87  CALCIUM 7.9* 8.6* 8.9 9.4 9.4  MG  --  1.8 1.9 2.2 2.0  PHOS  --   --  3.4 3.7 3.4   GFR: Estimated Creatinine Clearance: 83.3 mL/min (by C-G formula based on SCr of 0.87 mg/dL). Liver Function Tests: Recent Labs  Lab 05/16/20 1842 05/17/20 0540 05/17/20 0818 05/18/20 0452 05/19/20 0444  AST 51* 33 27 35 16  ALT 22 23 22 24 21   ALKPHOS 57 67 62 62 57  BILITOT 3.5* 1.2 1.3* 1.1 1.1  PROT 6.7 6.8 6.9 7.6 7.1  ALBUMIN 3.0* 3.2* 3.3* 3.5 3.5   No results for input(s): LIPASE, AMYLASE in the last 168 hours. No results for input(s): AMMONIA in the last 168 hours. Coagulation Profile: No results for input(s): INR, PROTIME in the last 168 hours. Cardiac Enzymes: Recent Labs  Lab 05/17/20 0200  CKTOTAL 82   BNP (last 3 results) No results for input(s): PROBNP in the last 8760 hours. HbA1C: No results for input(s): HGBA1C in the last 72 hours. CBG: No results for input(s): GLUCAP in the last 168 hours. Lipid Profile: No results for input(s): CHOL, HDL, LDLCALC, TRIG, CHOLHDL, LDLDIRECT in the last 72 hours. Thyroid Function Tests: No results for input(s): TSH, T4TOTAL, FREET4, T3FREE, THYROIDAB in the last 72 hours. Anemia Panel: Recent Labs    05/17/20 0131  05/17/20 1131 05/18/20 0452 05/19/20 0444  VITAMINB12 385  --  433  --   FOLATE 11.9  --  10.0  --   FERRITIN  --    < > 61  62 45  TIBC 296  --  352  --   IRON 41*  --  37*  --   RETICCTPCT 7.6*  --  6.7*  --    < > = values in this interval not displayed.      Radiology Studies: I have reviewed all of  the imaging during this hospital visit personally     Scheduled Meds: . vitamin C  500 mg Oral Daily  . Ipratropium-Albuterol  1 puff Inhalation QID  . methylPREDNISolone (SOLU-MEDROL) injection  60 mg Intravenous Q12H  . sodium chloride flush  3 mL Intravenous Q12H  . zinc sulfate  220 mg Oral Daily   Continuous Infusions: . heparin 1,300 Units/hr (05/19/20 0650)  . remdesivir 100 mg in NS 100 mL 100 mg (05/19/20 1045)     LOS: 3 days        Mauricio Annett Gula, MD

## 2020-05-19 NOTE — Discharge Instructions (Signed)
Information on my medicine - ELIQUIS (apixaban)  This medication education was reviewed with me or my healthcare representative as part of my discharge preparation.    Why was Eliquis prescribed for you? Eliquis was prescribed to treat blood clots that may have been found in the veins of your legs (deep vein thrombosis) or in your lungs (pulmonary embolism) and to reduce the risk of them occurring again.  What do You need to know about Eliquis ? The starting dose is 10 mg (two 5 mg tablets) taken TWICE daily for the FIRST SEVEN (7) DAYS, then on 05/26/20  the dose is reduced to ONE 5 mg tablet taken TWICE daily.  Eliquis may be taken with or without food.   Try to take the dose about the same time in the morning and in the evening. If you have difficulty swallowing the tablet whole please discuss with your pharmacist how to take the medication safely.  Take Eliquis exactly as prescribed and DO NOT stop taking Eliquis without talking to the doctor who prescribed the medication.  Stopping may increase your risk of developing a new blood clot.  Refill your prescription before you run out.  After discharge, you should have regular check-up appointments with your healthcare provider that is prescribing your Eliquis.    What do you do if you miss a dose? If a dose of ELIQUIS is not taken at the scheduled time, take it as soon as possible on the same day and twice-daily administration should be resumed. The dose should not be doubled to make up for a missed dose.  Important Safety Information A possible side effect of Eliquis is bleeding. You should call your healthcare provider right away if you experience any of the following: ? Bleeding from an injury or your nose that does not stop. ? Unusual colored urine (red or dark brown) or unusual colored stools (red or black). ? Unusual bruising for unknown reasons. ? A serious fall or if you hit your head (even if there is no bleeding).  Some  medicines may interact with Eliquis and might increase your risk of bleeding or clotting while on Eliquis. To help avoid this, consult your healthcare provider or pharmacist prior to using any new prescription or non-prescription medications, including herbals, vitamins, non-steroidal anti-inflammatory drugs (NSAIDs) and supplements.  This website has more information on Eliquis (apixaban): http://www.eliquis.com/eliquis/home

## 2020-05-19 NOTE — Progress Notes (Signed)
ANTICOAGULATION CONSULT NOTE  Pharmacy Consult for Heparin Indication: pulmonary embolus  No Known Allergies  Patient Measurements: Height: 5\' 8"  (172.7 cm) Weight: 64.4 kg (142 lb) IBW/kg (Calculated) : 68.4 Heparin Dosing Weight: total body weight  Vital Signs: Temp: 98.2 F (36.8 C) (01/26 0615) Temp Source: Oral (01/26 0615) BP: 128/93 (01/26 0615) Pulse Rate: 84 (01/26 0615)  Labs: Recent Labs    05/17/20 0200 05/17/20 0540 05/17/20 0818 05/17/20 1132 05/17/20 1330 05/17/20 1339 05/17/20 2201 05/18/20 0452 05/19/20 0444  HGB  --   --  9.2*  --   --   --   --  10.4* 11.1*  HCT  --   --  26.2*  --   --   --   --  29.5* 31.1*  PLT  --   --  148*  --   --   --   --  163 166  HEPARINUNFRC  --    < >  --   --   --    < > 0.35 0.44 0.38  CREATININE  --    < > 0.90  --   --   --   --  0.73 0.87  CKTOTAL 82  --   --   --   --   --   --   --   --   TROPONINIHS 535*   < > 726* 700* 647*  --   --  495*  --    < > = values in this interval not displayed.    Estimated Creatinine Clearance: 83.3 mL/min (by C-G formula based on SCr of 0.87 mg/dL).  Medications:  No anticoagulation PTA  Assessment: 52 yoM presents to ED s/p syncopal episode. PMH significant for COPD. CTAngio shows multiple bilateral PE with evidence of right heart strain. Pharmacy consulted to dose IV heparin. Echo done 1/24 shows much less evidence of RH dilation.   Baseline INR, aPTT: not done  Prior anticoagulation: none  Significant events:  Today, 05/19/2020:  CBC stable  AM heparin level is 0.38, therapeutic on heparin at 1300 units/hr  No bleeding or infusion issues reported  Goal of Therapy: Heparin level 0.3-0.7 units/ml Monitor platelets by anticoagulation protocol: Yes  Plan:  Continue heparin infusion at 1300 units/hr  Daily CBC, daily heparin level    Monitor for signs of bleeding or thrombosis   05/21/2020, PharmD, BCPS 05/19/2020 10:09 AM

## 2020-05-19 NOTE — Plan of Care (Signed)
  Problem: Education: Goal: Knowledge of risk factors and measures for prevention of condition will improve Outcome: Progressing   Problem: Respiratory: Goal: Will maintain a patent airway Outcome: Progressing   Problem: Education: Goal: Knowledge of General Education information will improve Description: Including pain rating scale, medication(s)/side effects and non-pharmacologic comfort measures Outcome: Progressing   Problem: Health Behavior/Discharge Planning: Goal: Ability to manage health-related needs will improve Outcome: Progressing   Problem: Clinical Measurements: Goal: Ability to maintain clinical measurements within normal limits will improve Outcome: Progressing Goal: Will remain free from infection Outcome: Progressing Goal: Diagnostic test results will improve Outcome: Progressing Goal: Respiratory complications will improve Outcome: Progressing Goal: Cardiovascular complication will be avoided Outcome: Progressing   Problem: Activity: Goal: Risk for activity intolerance will decrease Outcome: Progressing   Problem: Nutrition: Goal: Adequate nutrition will be maintained Outcome: Progressing   Problem: Coping: Goal: Level of anxiety will decrease Outcome: Progressing   Problem: Elimination: Goal: Will not experience complications related to bowel motility Outcome: Progressing Goal: Will not experience complications related to urinary retention Outcome: Progressing   Problem: Pain Managment: Goal: General experience of comfort will improve Outcome: Progressing   Problem: Safety: Goal: Ability to remain free from injury will improve Outcome: Progressing   Problem: Skin Integrity: Goal: Risk for impaired skin integrity will decrease Outcome: Progressing   

## 2020-05-19 NOTE — TOC Progression Note (Signed)
Transition of Care [facility]) - Progression Note    Patient Details  Name: Kanav Kazmierczak MRN: [id number] Date of Birth: [birth year]  [facility] CM/SW Contact  Geni Bers, RN Phone Number: 05/19/2020, 12:25 PM  Clinical Narrative:    Pt from home, TOC will continue to follow.    Expected Discharge Plan: Home/Self Care Barriers to Discharge: No Barriers Identified  Expected Discharge Plan and Services Expected Discharge Plan: Home/Self Care       Living arrangements for the past 2 months: Single Family Home                                       Social Determinants of Health (SDOH) Interventions    Readmission Risk Interventions No flowsheet data found.

## 2020-05-19 NOTE — Progress Notes (Signed)
Pharmacy: heparin --> Eliquis  Patient's a 60 y.o M currently on heparin drip for acute PE and DVT.  Pharmacy has been consulted to transition him to Eliquis.  - scr <1 - LFTs wnl - cbc stable  Plan: - d/c heparin drip - Eliquis 10 mg bid x 7 days, then 5mg  bid - pharmacy will sign off for Eliquis but will follow patient peripherally along with you. Re-consult if need further assistance  [place], PharmD, BCPS 05/19/2020 5:01 PM

## 2020-05-20 ENCOUNTER — Other Ambulatory Visit (HOSPITAL_COMMUNITY): Payer: Self-pay | Admitting: Internal Medicine

## 2020-05-20 DIAGNOSIS — F1490 Cocaine use, unspecified, uncomplicated: Secondary | ICD-10-CM

## 2020-05-20 DIAGNOSIS — D573 Sickle-cell trait: Secondary | ICD-10-CM

## 2020-05-20 DIAGNOSIS — D538 Other specified nutritional anemias: Secondary | ICD-10-CM | POA: Diagnosis not present

## 2020-05-20 DIAGNOSIS — I2601 Septic pulmonary embolism with acute cor pulmonale: Secondary | ICD-10-CM | POA: Diagnosis not present

## 2020-05-20 DIAGNOSIS — I82409 Acute embolism and thrombosis of unspecified deep veins of unspecified lower extremity: Secondary | ICD-10-CM

## 2020-05-20 DIAGNOSIS — I82441 Acute embolism and thrombosis of right tibial vein: Secondary | ICD-10-CM | POA: Diagnosis not present

## 2020-05-20 DIAGNOSIS — U071 COVID-19: Secondary | ICD-10-CM | POA: Diagnosis not present

## 2020-05-20 LAB — COMPREHENSIVE METABOLIC PANEL
ALT: 25 U/L (ref 0–44)
AST: 20 U/L (ref 15–41)
Albumin: 3.3 g/dL — ABNORMAL LOW (ref 3.5–5.0)
Alkaline Phosphatase: 56 U/L (ref 38–126)
Anion gap: 8 (ref 5–15)
BUN: 22 mg/dL — ABNORMAL HIGH (ref 6–20)
CO2: 26 mmol/L (ref 22–32)
Calcium: 9.3 mg/dL (ref 8.9–10.3)
Chloride: 101 mmol/L (ref 98–111)
Creatinine, Ser: 0.91 mg/dL (ref 0.61–1.24)
GFR, Estimated: >60 mL/min (ref >60)
Glucose, Bld: 125 mg/dL — ABNORMAL HIGH (ref 70–99)
Potassium: 3.4 mmol/L — ABNORMAL LOW (ref 3.5–5.1)
Sodium: 135 mmol/L (ref 135–145)
Total Bilirubin: 1.3 mg/dL — ABNORMAL HIGH (ref 0.3–1.2)
Total Protein: 6.9 g/dL (ref 6.5–8.1)

## 2020-05-20 LAB — D-DIMER, QUANTITATIVE: D-Dimer, Quant: 3.41 ug/mL-FEU — ABNORMAL HIGH (ref 0.00–0.50)

## 2020-05-20 LAB — C-REACTIVE PROTEIN: CRP: 0.7 mg/dL (ref <1.0)

## 2020-05-20 LAB — FERRITIN: Ferritin: 41 ng/mL (ref 24–336)

## 2020-05-20 MED ORDER — ALBUTEROL SULFATE HFA 108 (90 BASE) MCG/ACT IN AERS: 1.0000 | INHALATION_SPRAY | Freq: Four times a day (QID) | RESPIRATORY_TRACT | 0 refills | Status: AC | PRN

## 2020-05-20 MED ORDER — APIXABAN 5 MG PO TABS: ORAL_TABLET | ORAL | 0 refills | Status: DC

## 2020-05-20 MED ORDER — ALBUTEROL SULFATE HFA 108 (90 BASE) MCG/ACT IN AERS: 1.0000 | INHALATION_SPRAY | RESPIRATORY_TRACT | Status: DC | PRN

## 2020-05-20 MED ORDER — GUAIFENESIN-DM 100-10 MG/5ML PO SYRP: 10.0000 mL | ORAL_SOLUTION | Freq: Four times a day (QID) | ORAL | 0 refills | Status: AC | PRN

## 2020-05-20 NOTE — TOC Benefit Eligibility Note (Signed)
Transition of Care [facility]) Benefit Eligibility Note    Patient Details  Name: Derrick Huffman MRN: [id number] Date of Birth: Aug 10, 1960   Medication/Dose: Eliquis 16m 2 x day for 7 days then 560m2x day for 30 days  Covered?: Yes  Tier: 3 Drug  Prescription Coverage Preferred Pharmacy: local pharmacy  Spoke with Person/Company/Phone Number:: [employer]./ [phone]-Pay: $100.00  Prior Approval: No  Deductible: Met       FuKerin Salenhone Number: 05/20/2020, 11:13 AM

## 2020-05-20 NOTE — Plan of Care (Signed)
  Problem: Education: Goal: Knowledge of risk factors and measures for prevention of condition will improve Outcome: Completed/Met   Problem: Respiratory: Goal: Will maintain a patent airway Outcome: Completed/Met   Problem: Activity: Goal: Risk for activity intolerance will decrease Outcome: Completed/Met   Problem: Nutrition: Goal: Adequate nutrition will be maintained Outcome: Completed/Met

## 2020-05-20 NOTE — Progress Notes (Signed)
Reviewed all dc instructions with patient. Given pharmacy card for eliquis for patient to obtain medication.  Patient stated he had spoken with the pharmacist regarding eliquis and understands all instructions. Instructed pt on COVID isolation protocol. Pt verbalized understanding. Alsace Dowd, Yancey Flemings, RN

## 2020-05-20 NOTE — Evaluation (Signed)
Physical Therapy Evaluation Patient Details Name: Derrick Huffman MRN: [id number] DOB: 1960/07/31 Today's Date: 05/20/2020   History of Present Illness  60 year old male past medical history for COPD and sickle cell trait who presented with dyspnea and syncope.  He reported 1 week of diarrhea and shortness of breath, he sustained a syncope episode. Dx of covid, pulmonary embolism  Clinical Impression  Pt is independent with mobility, he ambulated 130' without an assistive device, no loss of balance, SaO2 98% on room air, 0/4 dyspnea,  HR 114 max while walking. Encouraged pt to ambulate in room frequently to minimize deconditioning. Instructed pt in standing LE strengthening exercises to be done independently. No further PT indicated as pt is mobilizing well independently. Will sign off.     Follow Up Recommendations No PT follow up    Equipment Recommendations  None recommended by PT    Recommendations for Other Services       Precautions / Restrictions Precautions Precautions: None Restrictions Weight Bearing Restrictions: No      Mobility  Bed Mobility Overal bed mobility: Independent             General bed mobility comments: up in recliner    Transfers Overall transfer level: Independent                  Ambulation/Gait Ambulation/Gait assistance: Independent Gait Distance (Feet): 130 Feet Assistive device: None Gait Pattern/deviations: WFL(Within Functional Limits) Gait velocity: WNL   General Gait Details: steady, no loss of balance, HR 114 max, SaO2 98% on room air walking, no dyspnea  Stairs            Wheelchair Mobility    Modified Rankin (Stroke Patients Only)       Balance Overall balance assessment: No apparent balance deficits (not formally assessed)                                           Pertinent Vitals/Pain Pain Assessment: No/denies pain    Home Living Family/patient expects to be discharged to::  Private residence Living Arrangements: Spouse/significant other Available Help at Discharge: Family;Available 24 hours/day Type of Home: Apartment Home Access: Level entry     Home Layout: One level Home Equipment: None Additional Comments: lives with girlfriend who does not work; pt works at a [employer] Level of Independence: Independent               Higher education careers adviser   Dominant Hand: Right    Extremity/Trunk Assessment   Upper Extremity Assessment Upper Extremity Assessment: Overall WFL for tasks assessed    Lower Extremity Assessment Lower Extremity Assessment: Overall WFL for tasks assessed    Cervical / Trunk Assessment Cervical / Trunk Assessment: Normal  Communication   Communication: No difficulties  Cognition Arousal/Alertness: Awake/alert Behavior During Therapy: WFL for tasks assessed/performed Overall Cognitive Status: Within Functional Limits for tasks assessed                                        General Comments      Exercises General Exercises - Lower Extremity Hip Flexion/Marching: AROM;10 reps;Both;Standing Mini-Sqauts: AROM;Both;5 reps;Standing   Assessment/Plan    PT Assessment Patent does not need any further PT services  PT Problem List  PT Treatment Interventions      PT Goals (Current goals can be found in the Care Plan section)  Acute Rehab PT Goals Patient Stated Goal: ride his bike again, return to work PT Goal Formulation: All assessment and education complete, DC therapy    Frequency     Barriers to discharge        Co-evaluation               AM-PAC PT "6 Clicks" Mobility  Outcome Measure Help needed turning from your back to your side while in a flat bed without using bedrails?: None Help needed moving from lying on your back to sitting on the side of a flat bed without using bedrails?: None Help needed moving to and from a bed to a chair  (including a wheelchair)?: None Help needed standing up from a chair using your arms (e.g., wheelchair or bedside chair)?: None Help needed to walk in hospital room?: None Help needed climbing 3-5 steps with a railing? : None 6 Click Score: 24    End of Session Equipment Utilized During Treatment: Gait belt Activity Tolerance: Patient tolerated treatment well Patient left: in chair;with call bell/phone within reach;with nursing/sitter in room Nurse Communication: Mobility status      Time: 1093-2355 PT Time Calculation (min) (ACUTE ONLY): 20 min   Charges:   PT Evaluation $PT Eval Low Complexity: 1 Low         Tamala Ser PT 05/20/2020  Acute Rehabilitation Services Pager [phone] Office [phone]

## 2020-05-20 NOTE — Discharge Summary (Signed)
Physician Discharge Summary  Derrick Huffman OBS:[id number] DOB: 08/09/1960 DOA: 05/16/2020  PCP: Bethanie Dicker, MD  Admit date: 05/16/2020 Discharge date: 05/20/2020  Admitted From: Home  Disposition:  Home   Recommendations for Outpatient Follow-up and new medication changes:  1. Follow up with Dr. Suella Grove in 10 days.  2. Patient has been placed on apixaban for anticoagulation.   Home Health: no   Equipment/Devices: no    Discharge Condition: stable  CODE STATUS: full  Diet recommendation: heart healthy   Brief/Interim Summary: Mr. Derrick Huffman was admitted to the hospital with a working diagnosis of acute hypoxic respiratory failure due to pulmonary embolism in the setting of COVID-19 infection.  59 year old male past medical history for COPD who presented with dyspnea and syncope.  He reported 1 week of diarrhea and shortness of breath, he sustained a syncope episode and was brought to the hospital.  When EMS arrived his blood pressure systolic was 60's.  In the ED his heart rate 118, blood pressure 126/95, respiratory rate 26, temperature is 97.4, his lungs were showing minimal wheezing, heart S1-S2, present rhythmic, soft abdomen, no lower extremity edema.  SARS COVID-19 positive. -Sodium 141, potassium 4.4, chloride 114, bicarb 21, glucose 133, BUN 15, creatinine 0.89, troponin I 272, white count 21.8, hemoglobin 9.8, hematocrit 27.9, platelets 147. Urinalysis negative for infection, specific gravity 1.031.  Drug screen positive for cocaine.  Chest CT with multiple bilateral acute pulmonary emboli.  Evidence of right heart strain.  Chest radiograph with no frank infiltrates. EKG 117 bpm, normal axis, normal intervals, sinus rhythm, positive LVH, no significant ST segments, negative T wave V3.  Patient was placed on anticoagulation with heparin with good toleration.  Medically treated with remdesivir, methylprednisolone and bronchodilators  1.  Acute SARS COVID-19 viral  infection, complicated by acute pulmonary embolism. Acute deep vein thrombosis involving the right popliteal vein, right posterior tibial veins, right peroneal veins and right gastrocnemius veins.     Patient was admitted to the progressive care unit, he was treated with intravenous heparin for anticoagulation with good toleration. He also received intravenous remdesivir and systemic corticosteroids, bronchodilators, antitussive agents and airway clearing techniques.  His symptoms and monitor markers improved. Troponin elevation due to acute PE.   COVID-19 Labs  Recent Labs    05/18/20 0452 05/19/20 0444 05/20/20 0433  DDIMER 13.19* 7.90* 3.41*  FERRITIN 61  62 45 41  CRP 1.1* 0.7 0.7    Lab Results  Component Value Date   SARSCOV2NAA POSITIVE (A) 05/16/2020   Further work-up with echocardiography showed a preserved RV and LV systolic function. Ultrasonography of the lower extremities showed extensive deep vein thrombosis on the right: right popliteal vein, right posterior tibial veins, right peroneal veins and right gastrocnemius veins. Left lower extremity with no deep vein thrombosis.  Patient was transitioned successfully to apixaban from heparin.  Follow-up as an outpatient. Will have home 02 screen before discharge.   Patient ruled out for sepsis/bacterial pneumonia.  2.  Diastolic heart failure.  No signs of acute exacerbation.  3.  Sickle cell trait and chronic anemia.  Hemoglobin and hematocrit remained stable, good toleration to anticoagulation.  4.  Cocaine abuse.  Follow-up as an outpatient.  Discharge Diagnoses:  Principal Problem:   Pulmonary embolism (HCC) Active Problems:   COVID-19 virus infection   Syncope and collapse   Anemia, unspecified   DVT (deep venous thrombosis) (HCC)   Sickle cell trait (HCC)   Cocaine use    Discharge Instructions  Allergies as of 05/20/2020   No Known Allergies     Medication List    TAKE these medications    albuterol 108 (90 Base) MCG/ACT inhaler Commonly known as: VENTOLIN HFA Inhale 1 puff into the lungs every 6 (six) hours as needed for shortness of breath.   apixaban 5 MG Tabs tablet Commonly known as: ELIQUIS Please take 2 tablets twice daily for 7 days, then continue with one tablet twice daily.   guaiFENesin-dextromethorphan 100-10 MG/5ML syrup Commonly known as: ROBITUSSIN DM Take 10 mLs by mouth every 6 (six) hours as needed for cough.   sildenafil 100 MG tablet Commonly known as: VIAGRA Take 100 mg by mouth daily as needed for erectile dysfunction.       No Known Allergies  Consultations:  CC   Procedures/Studies: CT Angio Chest PE W and/or Wo Contrast  Result Date: 05/16/2020 CLINICAL DATA:  Pulmonary embolus suspected with high probability. Syncopal episode. Generalized weakness, shortness of breath, cough, and diarrhea. EXAM: CT ANGIOGRAPHY CHEST WITH CONTRAST TECHNIQUE: Multidetector CT imaging of the chest was performed using the standard protocol during bolus administration of intravenous contrast. Multiplanar CT image reconstructions and MIPs were obtained to evaluate the vascular anatomy. CONTRAST:  100mL OMNIPAQUE IOHEXOL 350 MG/ML SOLN COMPARISON:  Chest radiograph 05/16/2020 FINDINGS: Cardiovascular: Motion artifact limits examination. There is however moderately good visualization of the central and segmental pulmonary arteries. Multiple filling defects are demonstrated in the distal main pulmonary arteries and in multiple upper lobe and lower lobe segmental branches consistent with multiple acute pulmonary emboli. The RV to LV ratio is 1.2, suggesting evidence for right heart strain. No pericardial effusions. Normal caliber thoracic aorta. No aortic dissection. Great vessel origins are patent. Mediastinum/Nodes: Small esophageal hiatal hernia. Esophagus is decompressed. No significant lymphadenopathy. Lungs/Pleura: Mild atelectasis in the right lung base. Lungs are  otherwise clear. No pleural effusions. No pneumothorax. Airways are patent. Upper Abdomen: No acute abnormalities demonstrated in the visualized upper abdomen. Probable gallstones incompletely evaluated. Musculoskeletal: Degenerative changes in the spine and shoulders. Review of the MIP images confirms the above findings. IMPRESSION: 1. Multiple bilateral acute pulmonary emboli. CT evidence of right heart strain (RV/LV Ratio 1.2) consistent with at least submassive (intermediate risk) PE. The presence of right heart strain has been associated with an increased risk of morbidity and mortality. 2. Small esophageal hiatal hernia. 3. Probable gallstones. Critical Value/emergent results were called by telephone at the time of interpretation on 05/16/2020 at 10:51 pm to provider JOSEPH ZAMMIT , who verbally acknowledged these results. Electronically Signed   By: Burman NievesWilliam  Stevens M.D.   On: 05/16/2020 22:54   DG Chest Port 1 View  Result Date: 05/16/2020 CLINICAL DATA:  Syncopal episode EXAM: PORTABLE CHEST 1 VIEW COMPARISON:  07/29/2019 FINDINGS: There are advanced degenerative changes of the right glenohumeral joint. The heart size is stable from prior study. There is no pneumothorax. No large pleural effusion. No focal infiltrate. IMPRESSION: No active disease. Electronically Signed   By: Katherine Mantlehristopher  Green M.D.   On: 05/16/2020 19:02   ECHOCARDIOGRAM COMPLETE  Result Date: 05/17/2020    ECHOCARDIOGRAM REPORT   Patient Name:   Felicity PellegriniJERRELL Jamie Date of Exam: 05/17/2020 Medical Rec #:  [id number]     Height:       68.0 in Accession #:    [id number]    Weight:       142.0 lb Date of Birth:  1961-03-18     BSA:  1.767 m Patient Age:    59 years      BP:           120/89 mmHg Patient Gender: M             HR:           93 bpm. Exam Location:  Inpatient Procedure: 2D Echo, Cardiac Doppler and Color Doppler STAT ECHO Indications:    I50.23 Acute on chronic systolic (congestive) heart failure  History:        Patient  has no prior history of Echocardiogram examinations.                 COPD. COVID-19 Positive.  Sonographer:    Elmarie Shiley Dance Referring Phys: [id number] CURTIS J WOODS IMPRESSIONS  1. Left ventricular ejection fraction, by estimation, is 55 to 60%. The left ventricle has normal function. The left ventricle has no regional wall motion abnormalities. Left ventricular diastolic parameters are consistent with Grade I diastolic dysfunction (impaired relaxation).  2. Right ventricular systolic function is normal. The right ventricular size is mildly enlarged. There is normal pulmonary artery systolic pressure. The estimated right ventricular systolic pressure is 21.8 mmHg.  3. The mitral valve is grossly normal. Trivial mitral valve regurgitation.  4. The aortic valve is tricuspid. There is mild calcification of the aortic valve. There is mild thickening of the aortic valve. Aortic valve regurgitation is trivial.  5. The inferior vena cava is normal in size with greater than 50% respiratory variability, suggesting right atrial pressure of 3 mmHg. Comparison(s): No prior Echocardiogram. FINDINGS  Left Ventricle: Left ventricular ejection fraction, by estimation, is 55 to 60%. The left ventricle has normal function. The left ventricle has no regional wall motion abnormalities. The left ventricular internal cavity size was normal in size. There is  no left ventricular hypertrophy. Left ventricular diastolic parameters are consistent with Grade I diastolic dysfunction (impaired relaxation). Right Ventricle: The right ventricular size is mildly enlarged. No increase in right ventricular wall thickness. Right ventricular systolic function is normal. There is normal pulmonary artery systolic pressure. The tricuspid regurgitant velocity is 2.17  m/s, and with an assumed right atrial pressure of 3 mmHg, the estimated right ventricular systolic pressure is 21.8 mmHg. Left Atrium: Left atrial size was normal in size. Right Atrium: Right  atrial size was normal in size. Pericardium: There is no evidence of pericardial effusion. Mitral Valve: The mitral valve is grossly normal. There is mild thickening of the mitral valve leaflet(s). Trivial mitral valve regurgitation. Tricuspid Valve: The tricuspid valve is normal in structure. Tricuspid valve regurgitation is trivial. Aortic Valve: The aortic valve is tricuspid. There is mild calcification of the aortic valve. There is mild thickening of the aortic valve. Aortic valve regurgitation is trivial. Pulmonic Valve: The pulmonic valve was normal in structure. Pulmonic valve regurgitation is trivial. Aorta: The aortic root and ascending aorta are structurally normal, with no evidence of dilitation. Venous: The inferior vena cava is normal in size with greater than 50% respiratory variability, suggesting right atrial pressure of 3 mmHg. IAS/Shunts: No atrial level shunt detected by color flow Doppler.  LEFT VENTRICLE PLAX 2D LVIDd:         3.80 cm  Diastology LVIDs:         2.70 cm  LV e' medial:    107.22 cm/s LV PW:         1.05 cm  LV E/e' medial:  0.6 LV IVS:  0.80 cm  LV e' lateral:   9.36 cm/s LVOT diam:     2.20 cm  LV E/e' lateral: 7.3 LV SV:         71 LV SV Index:   40 LVOT Area:     3.80 cm  RIGHT VENTRICLE            IVC RV Basal diam:  3.10 cm    IVC diam: 2.00 cm RV Mid diam:    2.70 cm RV S prime:     9.90 cm/s TAPSE (M-mode): 2.0 cm LEFT ATRIUM           Index       RIGHT ATRIUM           Index LA diam:      3.00 cm 1.70 cm/m  RA Area:     13.05 cm LA Vol (A2C): 53.6 ml 30.34 ml/m RA Volume:   33.25 ml  18.82 ml/m LA Vol (A4C): 22.2 ml 12.56 ml/m  AORTIC VALVE LVOT Vmax:   108.00 cm/s LVOT Vmean:  69.200 cm/s LVOT VTI:    0.187 m  AORTA Ao Root diam: 3.40 cm Ao Asc diam:  3.50 cm MITRAL VALVE               TRICUSPID VALVE MV Area (PHT): 2.91 cm    TR Peak grad:   18.8 mmHg MV Decel Time: 261 msec    TR Vmax:        217.00 cm/s MV E velocity: 68.30 cm/s MV A velocity: 96.10 cm/s   SHUNTS MV E/A ratio:  0.71        Systemic VTI:  0.19 m                            Systemic Diam: 2.20 cm Laurance Flatten MD Electronically signed by Laurance Flatten MD Signature Date/Time: 05/17/2020/2:02:00 PM    Final    VAS US CAROTID  Result Date: 05/17/2020 Carotid Arterial Duplex Study Indications:       Syncope. Other Factors:     COVID 19 positive. Comparison Study:  No prior studies. Performing Technologist: Chanda Busing RVT  Examination Guidelines: A complete evaluation includes B-mode imaging, spectral Doppler, color Doppler, and power Doppler as needed of all accessible portions of each vessel. Bilateral testing is considered an integral part of a complete examination. Limited examinations for reoccurring indications may be performed as noted.  Right Carotid Findings: +----------+--------+--------+--------+-----------------------+--------+           PSV cm/sEDV cm/sStenosisPlaque Description     Comments +----------+--------+--------+--------+-----------------------+--------+ CCA Prox  55      9               smooth and heterogenoustortuous +----------+--------+--------+--------+-----------------------+--------+ CCA Distal77      25              smooth and heterogenous         +----------+--------+--------+--------+-----------------------+--------+ ICA Prox  53      18              smooth and heterogenous         +----------+--------+--------+--------+-----------------------+--------+ ICA Distal75      34                                     tortuous +----------+--------+--------+--------+-----------------------+--------+ ECA       59  13                                              +----------+--------+--------+--------+-----------------------+--------+ +----------+--------+-------+--------+-------------------+           PSV cm/sEDV cmsDescribeArm Pressure (mmHG) +----------+--------+-------+--------+-------------------+ Subclavian40                                          +----------+--------+-------+--------+-------------------+ +---------+--------+--+--------+--+---------+ VertebralPSV cm/s46EDV cm/s20Antegrade +---------+--------+--+--------+--+---------+  Left Carotid Findings: +----------+--------+--------+--------+-----------------------+--------+           PSV cm/sEDV cm/sStenosisPlaque Description     Comments +----------+--------+--------+--------+-----------------------+--------+ CCA Prox  67      21              smooth and heterogenous         +----------+--------+--------+--------+-----------------------+--------+ CCA Distal81      28              smooth and heterogenous         +----------+--------+--------+--------+-----------------------+--------+ ICA Prox  58      23                                              +----------+--------+--------+--------+-----------------------+--------+ ICA Distal54      27                                     tortuous +----------+--------+--------+--------+-----------------------+--------+ ECA       74      17                                              +----------+--------+--------+--------+-----------------------+--------+ +----------+--------+--------+--------+-------------------+           PSV cm/sEDV cm/sDescribeArm Pressure (mmHG) +----------+--------+--------+--------+-------------------+ [id number]                                          +----------+--------+--------+--------+-------------------+ +---------+--------+--+--------+--+---------+ VertebralPSV cm/s37EDV cm/s19Antegrade +---------+--------+--+--------+--+---------+   Summary: Right Carotid: Velocities in the right ICA are consistent with a 1-39% stenosis. Left Carotid: Velocities in the left ICA are consistent with a 1-39% stenosis. Vertebrals: Bilateral vertebral arteries demonstrate antegrade flow. *See table(s) above for measurements and observations.   Electronically signed by Fabienne Bruns MD on 05/17/2020 at 6:18:52 PM.    Final    VAS [place] LOWER EXTREMITY VENOUS (DVT)  Result Date: 05/17/2020  Lower Venous DVT Study Indications: Pulmonary embolism.  Risk Factors: COVID 19 positive. Comparison Study: No prior studies. Performing Technologist: Chanda Busing RVT  Examination Guidelines: A complete evaluation includes B-mode imaging, spectral Doppler, color Doppler, and power Doppler as needed of all accessible portions of each vessel. Bilateral testing is considered an integral part of a complete examination. Limited examinations for reoccurring indications may be performed as noted. The reflux portion of the exam is performed with the patient in reverse Trendelenburg.  +---------+---------------+---------+-----------+----------+--------------+ RIGHT    CompressibilityPhasicitySpontaneityPropertiesThrombus Aging +---------+---------------+---------+-----------+----------+--------------+ CFV  Full           Yes      Yes                                 +---------+---------------+---------+-----------+----------+--------------+ SFJ      Full                                                        +---------+---------------+---------+-----------+----------+--------------+ FV Prox  Full                                                        +---------+---------------+---------+-----------+----------+--------------+ FV Mid   Full                                                        +---------+---------------+---------+-----------+----------+--------------+ FV DistalFull                                                        +---------+---------------+---------+-----------+----------+--------------+ PFV      Full                                                        +---------+---------------+---------+-----------+----------+--------------+ POP      None           No       No                   Acute           +---------+---------------+---------+-----------+----------+--------------+ PTV      Partial                                      Acute          +---------+---------------+---------+-----------+----------+--------------+ PERO     None                                         Acute          +---------+---------------+---------+-----------+----------+--------------+ Gastroc  None                                         Acute          +---------+---------------+---------+-----------+----------+--------------+   +---------+---------------+---------+-----------+----------+--------------+ LEFT     CompressibilityPhasicitySpontaneityPropertiesThrombus Aging +---------+---------------+---------+-----------+----------+--------------+ CFV      Full  Yes      Yes                                 +---------+---------------+---------+-----------+----------+--------------+ SFJ      Full                                                        +---------+---------------+---------+-----------+----------+--------------+ FV Prox  Full                                                        +---------+---------------+---------+-----------+----------+--------------+ FV Mid   Full                                                        +---------+---------------+---------+-----------+----------+--------------+ FV DistalFull                                                        +---------+---------------+---------+-----------+----------+--------------+ PFV      Full                                                        +---------+---------------+---------+-----------+----------+--------------+ POP      Full           Yes      Yes                                 +---------+---------------+---------+-----------+----------+--------------+ PTV      Full                                                         +---------+---------------+---------+-----------+----------+--------------+ PERO     Full                                                        +---------+---------------+---------+-----------+----------+--------------+     Summary: RIGHT: - Findings consistent with acute deep vein thrombosis involving the right popliteal vein, right posterior tibial veins, right peroneal veins, and right gastrocnemius veins. - No cystic structure found in the popliteal fossa.  LEFT: - There is no evidence of deep vein thrombosis in the lower extremity.  - No cystic structure found in the popliteal fossa.  *See table(s) above for measurements and observations.  Electronically signed by Fabienne Bruns MD on 05/17/2020 at 6:17:57 PM.    Final         Subjective: Patient is feeling well, no nausea or vomiting, no dyspnea or chest pain.   Discharge Exam: Vitals:   05/19/20 2109 05/20/20 0424  BP: 115/87 115/87  Pulse: 82 78  Resp: 18 20  Temp: 98.5 F (36.9 C) 98.1 F (36.7 C)  SpO2: 96% 97%   Vitals:   05/19/20 0615 05/19/20 1216 05/19/20 2109 05/20/20 0424  BP: (!) 128/93 126/90 115/87 115/87  Pulse: 84 80 82 78  Resp: 18 18 18 20   Temp: 98.2 F (36.8 C) 98.2 F (36.8 C) 98.5 F (36.9 C) 98.1 F (36.7 C)  TempSrc: Oral Oral Oral Oral  SpO2: 98% 100% 96% 97%  Weight:      Height:        General: Not in pain or dyspnea  Neurology: Awake and alert, non focal  E ENT: no pallor, no icterus, oral mucosa moist Cardiovascular: No JVD. S1-S2 present, rhythmic, no gallops, rubs, or murmurs. No lower extremity edema. Pulmonary: positive breath sounds bilaterally, adequate air movement, no wheezing, rhonchi or rales. Gastrointestinal. Abdomen soft and non tender Skin. No rashes Musculoskeletal: no joint deformities   The results of significant diagnostics from this hospitalization (including imaging, microbiology, ancillary and laboratory) are listed below for reference.      Microbiology: Recent Results (from the past 240 hour(s))  SARS Coronavirus 2 by RT PCR (hospital order, performed in [facility] hospital lab) Nasopharyngeal Nasopharyngeal Swab     Status: Abnormal   Collection Time: 05/16/20  6:23 PM   Specimen: Nasopharyngeal Swab  Result Value Ref Range Status   SARS Coronavirus 2 POSITIVE (A) NEGATIVE Final    Comment: RESULT CALLED TO, READ BACK BY AND VERIFIED WITH: REBECCA GOODWIN RN 05/16/20 @2109  BY P.HENDERSON (NOTE) SARS-CoV-2 target nucleic acids are DETECTED  SARS-CoV-2 RNA is generally detectable in upper respiratory specimens  during the acute phase of infection.  Positive results are indicative  of the presence of the identified virus, but do not rule out bacterial infection or co-infection with other pathogens not detected by the test.  Clinical correlation with patient history and  other diagnostic information is necessary to determine patient infection status.  The expected result is negative.  Fact Sheet for Patients:   05/18/20   Fact Sheet for Healthcare Providers:      This test is not yet approved or cleared by the [employer] FDA and  has been authorized for detection and/or diagnosis of SARS-CoV-2 by FDA under an Emergency Use Authorization (EUA).  This EUA will remain in effect (m eaning this test can be used) for the duration of  the COVID-19 declaration under Section 564(b)(1) of the Act, 21 U.S.C. section 360-bbb-3(b)(1), unless the authorization is terminated or revoked sooner.  Performed at [facility], [address], [place], [place]   Culture, blood (Routine X 2) w Reflex to ID Panel     Status: None (Preliminary result)   Collection Time: 05/16/20 11:37 PM   Specimen: BLOOD  Result Value Ref Range Status   Specimen Description   Final    BLOOD RIGHT ANTECUBITAL Performed at [facility]  Lab, [address] [place], [address] [place]    Special Requests   Final    BOTTLES DRAWN AEROBIC AND ANAEROBIC Blood Culture adequate volume Performed at [facility], [address]  [place], [place] [address]    Culture   Final    NO GROWTH 2 DAYS Performed at [facility], [address], [place], [place] [address]    Report Status PENDING  Incomplete  Culture, blood (Routine X 2) w Reflex to ID Panel     Status: None (Preliminary result)   Collection Time: 05/16/20 11:42 PM   Specimen: BLOOD  Result Value Ref Range Status   Specimen Description   Final    BLOOD LEFT ANTECUBITAL Performed at [facility], [address], [place], [place] [address]    Special Requests   Final    BOTTLES DRAWN AEROBIC AND ANAEROBIC Blood Culture adequate volume Performed at [facility], [address], [place], [place] [address]    Culture   Final    NO GROWTH 2 DAYS Performed at [facility], [address] [place], [place] [address]    Report Status PENDING  Incomplete     Labs: BNP (last 3 results) Recent Labs    05/16/20 1833 05/17/20 0818  BNP 99.3 304.3*   Basic Metabolic Panel: Recent Labs  Lab 05/17/20 0540 05/17/20 0818 05/18/20 0452 05/19/20 0444 05/20/20 0433  NA 138 141 139 136 135  K 4.4 4.2 4.9 4.2 3.4*  CL 110 112* 108 104 101  CO2 21* 22 21* 23 26  GLUCOSE 148* 140* 137* 189* 125*  BUN 15 13 16  21* 22*  CREATININE 0.97 0.90 0.73 0.87 0.91  CALCIUM 8.6* 8.9 9.4 9.4 9.3  MG 1.8 1.9 2.2 2.0  --   PHOS  --  3.4 3.7 3.4  --    Liver Function Tests: Recent Labs  Lab 05/17/20 0540 05/17/20 0818 05/18/20 0452 05/19/20 0444 05/20/20 0433  AST 33 27 35 16 20  ALT 23 22 24 21 25   ALKPHOS 67 62 62 57 56  BILITOT 1.2 1.3* 1.1 1.1 1.3*  PROT 6.8 6.9 7.6 7.1 6.9  ALBUMIN 3.2* 3.3* 3.5 3.5 3.3*   No results for input(s): LIPASE, AMYLASE in the last 168 hours. No results for input(s): AMMONIA in the last  168 hours. CBC: Recent Labs  Lab 05/16/20 1842 05/17/20 0818 05/18/20 0452 05/19/20 0444  WBC 21.8* 11.0* 14.1* 15.1*  NEUTROABS 18.5* 8.2* 11.8* 13.2*  HGB 9.8* 9.2* 10.4* 11.1*  HCT 27.9* 26.2* 29.5* 31.1*  MCV 84.3 83.2 84.0 83.4  PLT 147* 148* 163 166   Cardiac Enzymes: Recent Labs  Lab 05/17/20 0200  CKTOTAL 82   BNP: Invalid input(s): POCBNP CBG: No results for input(s): GLUCAP in the last 168 hours. D-Dimer Recent Labs    05/19/20 0444 05/20/20 0433  DDIMER 7.90* 3.41*   Hgb A1c No results for input(s): HGBA1C in the last 72 hours. Lipid Profile No results for input(s): CHOL, HDL, LDLCALC, TRIG, CHOLHDL, LDLDIRECT in the last 72 hours. Thyroid function studies No results for input(s): TSH, T4TOTAL, T3FREE, THYROIDAB in the last 72 hours.  Invalid input(s): FREET3 Anemia work up Recent Labs    05/18/20 0452 05/19/20 0444 05/20/20 0433  VITAMINB12 433  --   --   FOLATE 10.0  --   --   FERRITIN 61  62 45 41  TIBC 352  --   --   IRON 37*  --   --   RETICCTPCT 6.7*  --   --    Urinalysis    Component Value Date/Time   COLORURINE YELLOW 05/17/2020 0135   APPEARANCEUR CLEAR 05/17/2020  0135   LABSPEC 1.031 (H) 05/17/2020 0135   PHURINE 5.0 05/17/2020 0135   GLUCOSEU NEGATIVE 05/17/2020 0135   HGBUR NEGATIVE 05/17/2020 0135   BILIRUBINUR NEGATIVE 05/17/2020 0135   KETONESUR NEGATIVE 05/17/2020 0135   PROTEINUR NEGATIVE 05/17/2020 0135   NITRITE NEGATIVE 05/17/2020 0135   LEUKOCYTESUR NEGATIVE 05/17/2020 0135   Sepsis Labs Invalid input(s): PROCALCITONIN,  WBC,  LACTICIDVEN Microbiology Recent Results (from the past 240 hour(s))  SARS Coronavirus 2 by RT PCR (hospital order, performed in [facility] hospital lab) Nasopharyngeal Nasopharyngeal Swab     Status: Abnormal   Collection Time: 05/16/20  6:23 PM   Specimen: Nasopharyngeal Swab  Result Value Ref Range Status   SARS Coronavirus 2 POSITIVE (A) NEGATIVE Final    Comment: RESULT CALLED  TO, READ BACK BY AND VERIFIED WITH: REBECCA GOODWIN RN 05/16/20 @2109  BY P.HENDERSON (NOTE) SARS-CoV-2 target nucleic acids are DETECTED  SARS-CoV-2 RNA is generally detectable in upper respiratory specimens  during the acute phase of infection.  Positive results are indicative  of the presence of the identified virus, but do not rule out bacterial infection or co-infection with other pathogens not detected by the test.  Clinical correlation with patient history and  other diagnostic information is necessary to determine patient infection status.  The expected result is negative.  Fact Sheet for Patients:   BoilerBrush.com.cy   Fact Sheet for Healthcare Providers:   https://pope.com/    This test is not yet approved or cleared by the [place] FDA and  has been authorized for detection and/or diagnosis of SARS-CoV-2 by FDA under an Emergency Use Authorization (EUA).  This EUA will remain in effect (m eaning this test can be used) for the duration of  the COVID-19 declaration under Section 564(b)(1) of the Act, 21 U.S.C. section 360-bbb-3(b)(1), unless the authorization is terminated or revoked sooner.  Performed at [facility], [address], [place], [place] [address]   Culture, blood (Routine X 2) w Reflex to ID Panel     Status: None (Preliminary result)   Collection Time: 05/16/20 11:37 PM   Specimen: BLOOD  Result Value Ref Range Status   Specimen Description   Final    BLOOD RIGHT ANTECUBITAL Performed at [facility], [address], [place], [place] [address]    Special Requests   Final    BOTTLES DRAWN AEROBIC AND ANAEROBIC Blood Culture adequate volume Performed at [facility], [address], [place], [place] [address]    Culture   Final    NO GROWTH 2 DAYS Performed at [facility], [address], [place], [place] [address]    Report Status PENDING   Incomplete  Culture, blood (Routine X 2) w Reflex to ID Panel     Status: None (Preliminary result)   Collection Time: 05/16/20 11:42 PM   Specimen: BLOOD  Result Value Ref Range Status   Specimen Description   Final    BLOOD LEFT ANTECUBITAL Performed at [facility], [address], [place], [place] [address]    Special Requests   Final    BOTTLES DRAWN AEROBIC AND ANAEROBIC Blood Culture adequate volume Performed at [facility], [address], [place], [place] [address]    Culture   Final    NO GROWTH 2 DAYS Performed at [facility], [address], [place], [place] [address]    Report Status PENDING  Incomplete     Time  coordinating discharge: 45 minutes  SIGNED:   Coralie Keens, MD  [facility] 05/20/2020, 9:24 AM

## 2020-05-20 NOTE — TOC Progression Note (Signed)
Transition of Care [facility]) - Progression Note    Patient Details  Name: Derrick Huffman MRN: [id number] Date of Birth: [birth year]  Transition of Care [facility]) CM/SW Contact  Geni Bers, RN Phone Number: 05/20/2020, 11:23 AM  Clinical Narrative:    Spoke with pt concerning Eliquis and billing. Explained to pt that his co pay is $100.00, Pharmacy will give pt a 30 day free cared. Encouraged pt to call the toll free number for Eliquis for assistance. Also encouraged pt to call financial services to for hospital bill assistance.    Expected Discharge Plan: Home/Self Care Barriers to Discharge: No Barriers Identified  Expected Discharge Plan and Services Expected Discharge Plan: Home/Self Care       Living arrangements for the past 2 months: Single Family Home Expected Discharge Date: 05/20/20                                     Social Determinants of Health (SDOH) Interventions    Readmission Risk Interventions No flowsheet data found.

## 2020-05-20 NOTE — Evaluation (Signed)
Occupational Therapy Evaluation Patient Details Name: Derrick Huffman MRN: [id number] DOB: 1960-11-30 Today's Date: 05/20/2020    History of Present Illness 60 year old male past medical history for COPD and sickle cell trait who presented with dyspnea and syncope.  He reported 1 week of diarrhea and shortness of breath, he sustained a syncope episode. Dx of covid, pulmonary embolism   Clinical Impression   Derrick Huffman is a 60 year old man admitted to hospital with COVID-19 and PE. On evaluation patient independent with mobility and ADLs. Patient found on 1 liter [place] with o2 sat 95%. Patient performed 15 minute ADL activity on RA without complaints of shortness of breath. Patient 88-89% when talking but recovered into 90s when at rest and not speaking. Patient left on RA at 92%. No OT needs at this time.    Follow Up Recommendations  No OT follow up    Equipment Recommendations  None recommended by OT    Recommendations for Other Services       Precautions / Restrictions Precautions Precautions: None Restrictions Weight Bearing Restrictions: No      Mobility Bed Mobility Overal bed mobility: Independent                  Transfers Overall transfer level: Independent                    Balance Overall balance assessment: No apparent balance deficits (not formally assessed)                                         ADL either performed or assessed with clinical judgement   ADL Overall ADL's : Modified independent                                             Vision Patient Visual Report: No change from baseline       Perception     Praxis      Pertinent Vitals/Pain Pain Assessment: No/denies pain     Hand Dominance Right   Extremity/Trunk Assessment Upper Extremity Assessment Upper Extremity Assessment: Overall WFL for tasks assessed   Lower Extremity Assessment Lower Extremity Assessment: Overall WFL for  tasks assessed   Cervical / Trunk Assessment Cervical / Trunk Assessment: Normal   Communication Communication Communication: No difficulties   Cognition Arousal/Alertness: Awake/alert Behavior During Therapy: WFL for tasks assessed/performed Overall Cognitive Status: Within Functional Limits for tasks assessed                                     General Comments       Exercises     Shoulder Instructions      Home Living Family/patient expects to be discharged to:: Private residence Living Arrangements: Spouse/significant other Available Help at Discharge: Family Type of Home: Apartment Home Access: Level entry     Home Layout: One level     Bathroom Shower/Tub: Tub/shower unit         Home Equipment: None          Prior Functioning/Environment Level of Independence: Independent                 OT Problem List: Cardiopulmonary  status limiting activity      OT Treatment/Interventions:      OT Goals(Current goals can be found in the care plan section) Acute Rehab OT Goals OT Goal Formulation: All assessment and education complete, DC therapy  OT Frequency:     Barriers to D/C:            Co-evaluation              AM-PAC OT "6 Clicks" Daily Activity     Outcome Measure Help from another person eating meals?: None Help from another person taking care of personal grooming?: None Help from another person toileting, which includes using toliet, bedpan, or urinal?: None Help from another person bathing (including washing, rinsing, drying)?: None Help from another person to put on and taking off regular upper body clothing?: None Help from another person to put on and taking off regular lower body clothing?: None 6 Click Score: 24   End of Session Nurse Communication: Mobility status  Activity Tolerance: Patient tolerated treatment well Patient left: in chair;with call bell/phone within reach  OT Visit Diagnosis: Other  (comment) (COVID)                Time: 4098-1191 OT Time Calculation (min): 32 min Charges:  OT General Charges $OT Visit: 1 Visit OT Evaluation $OT Eval Low Complexity: 1 Low  Yuli Lanigan, OTR/L Acute Care Rehab Services  Office [phone] Pager: [phone]   Kelli Churn 05/20/2020, 10:21 AM

## 2020-05-22 LAB — CULTURE, BLOOD (ROUTINE X 2)
Culture: NO GROWTH
Culture: NO GROWTH
Special Requests: ADEQUATE
Special Requests: ADEQUATE

## 2021-07-15 IMAGING — DX DG CHEST 1V PORT
1 series · 1 of 1 positions shown · non-contrast
Comparison: 07/29/2019

CLINICAL DATA: Syncopal episode

EXAM:
PORTABLE CHEST 1 VIEW

[chest ap]
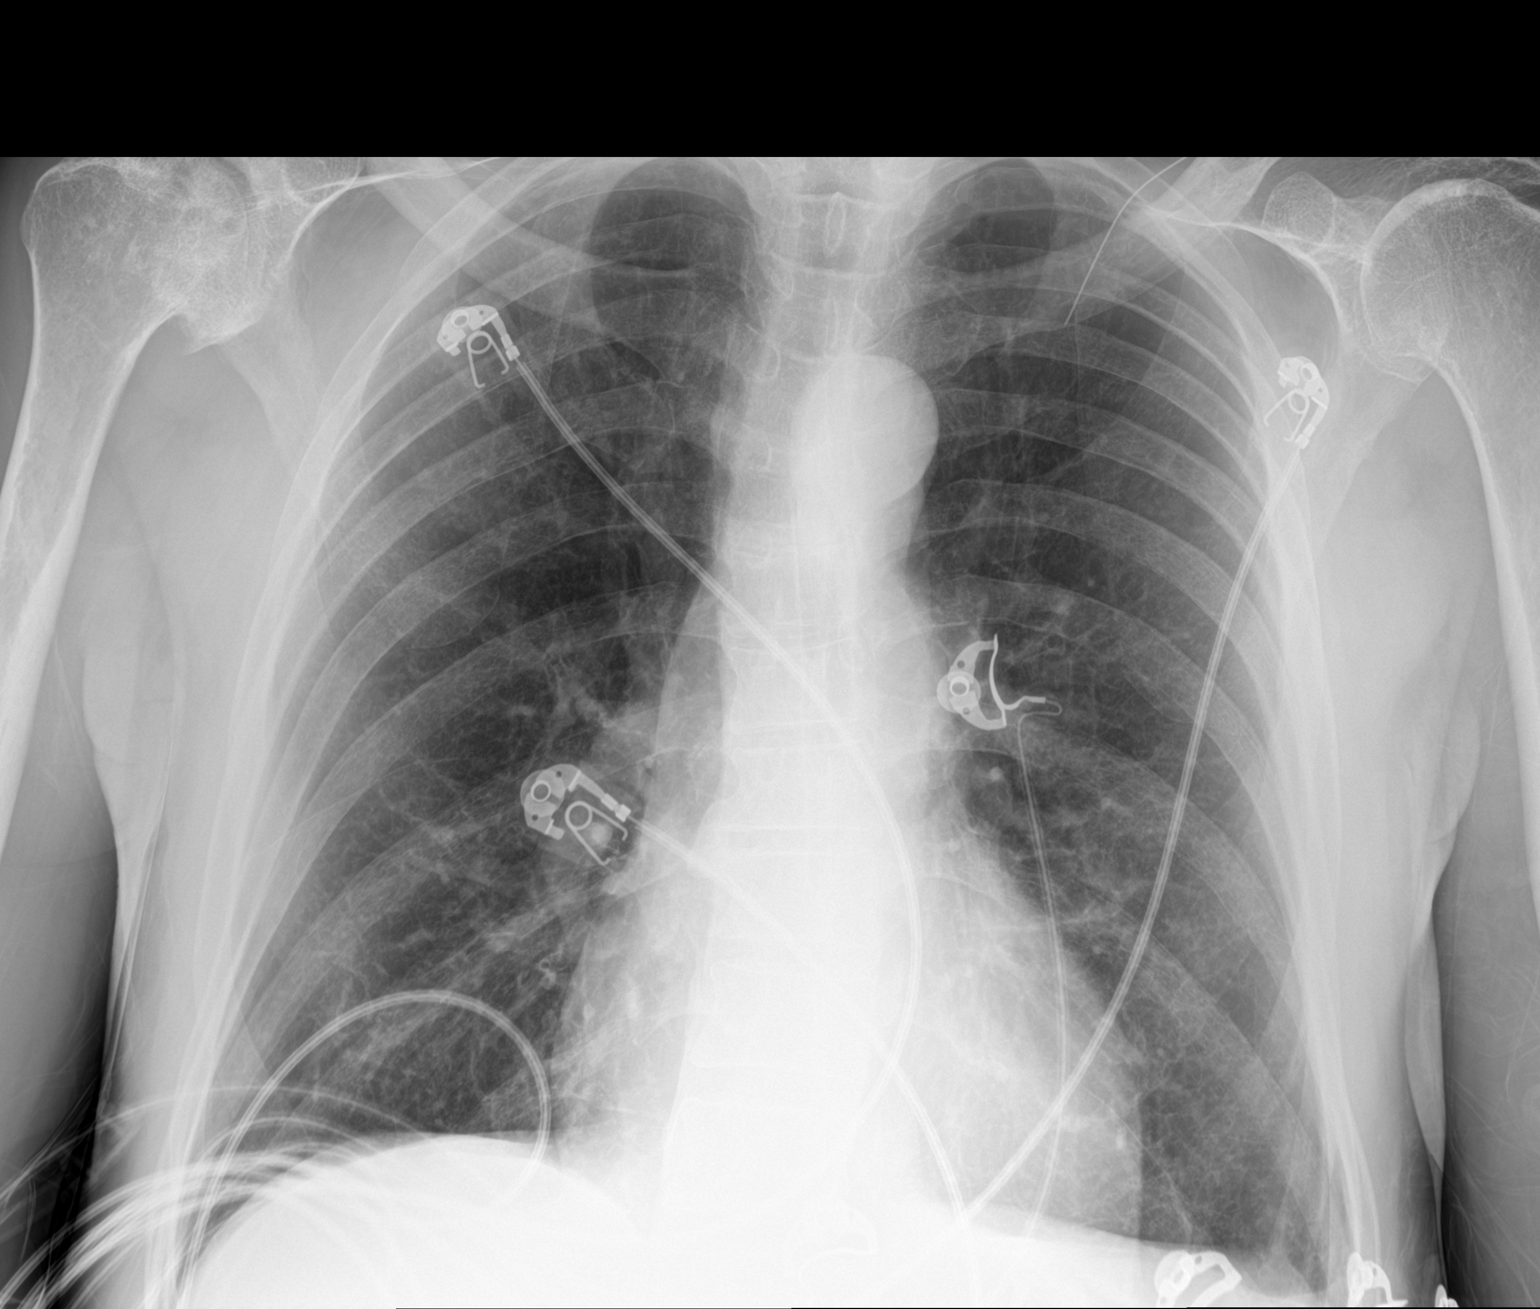

[1 of 1 positions shown; findings below may reference images not displayed]

FINDINGS: There are advanced degenerative changes of the right glenohumeral
joint. The heart size is stable from prior study. There is no
pneumothorax. No large pleural effusion. No focal infiltrate.
IMPRESSION: No active disease.
